# Patient Record
Sex: Male | Born: 1950 | Race: White | Hispanic: No | Marital: Married | Smoking: Former smoker
Health system: Southern US, Community
[De-identification: ages and names within clinical notes are randomized; demographics above are authoritative.]

## PROBLEM LIST (undated history)

## (undated) DIAGNOSIS — E119 Type 2 diabetes mellitus without complications: Secondary | ICD-10-CM

## (undated) DIAGNOSIS — I10 Essential (primary) hypertension: Secondary | ICD-10-CM

## (undated) DIAGNOSIS — I8500 Esophageal varices without bleeding: Secondary | ICD-10-CM

## (undated) HISTORY — PX: CAROTID ENDARTERECTOMY: SUR193

## (undated) HISTORY — PX: TONSILLECTOMY: SUR1361

---

## 2003-12-18 ENCOUNTER — Ambulatory Visit (HOSPITAL_COMMUNITY): Admission: EM | Admit: 2003-12-18 | Discharge: 2003-12-19 | Payer: Self-pay | Admitting: *Deleted

## 2006-02-22 ENCOUNTER — Ambulatory Visit: Payer: Self-pay | Admitting: Gastroenterology

## 2011-11-30 ENCOUNTER — Ambulatory Visit: Payer: Self-pay | Admitting: Oncology

## 2011-12-08 ENCOUNTER — Ambulatory Visit: Payer: Self-pay | Admitting: Oncology

## 2011-12-08 LAB — IRON AND TIBC
Iron Bind.Cap.(Total): 430 ug/dL (ref 250–450)
Iron Saturation: 24 %
Iron: 103 ug/dL (ref 65–175)
Unbound Iron-Bind.Cap.: 327 ug/dL

## 2011-12-08 LAB — CBC CANCER CENTER
Basophil #: 0 x10 3/mm (ref 0.0–0.1)
Eosinophil %: 1.6 %
HGB: 13.9 g/dL (ref 13.0–18.0)
Lymphocyte %: 23 %
Monocyte %: 8.9 %
Neutrophil %: 65.8 %
Platelet: 46 x10 3/mm — ABNORMAL LOW (ref 150–440)
RBC: 3.93 10*6/uL — ABNORMAL LOW (ref 4.40–5.90)
WBC: 2.4 x10 3/mm — ABNORMAL LOW (ref 3.8–10.6)

## 2011-12-08 LAB — LACTATE DEHYDROGENASE: LDH: 158 U/L (ref 87–241)

## 2011-12-08 LAB — FOLATE: Folic Acid: 7.6 ng/mL (ref 3.1–100.0)

## 2011-12-22 LAB — CBC CANCER CENTER
Basophil #: 0 x10 3/mm (ref 0.0–0.1)
Eosinophil #: 0.1 x10 3/mm (ref 0.0–0.7)
Eosinophil %: 3 %
HGB: 13.9 g/dL (ref 13.0–18.0)
Lymphocyte %: 22 %
MCH: 35 pg — ABNORMAL HIGH (ref 26.0–34.0)
MCHC: 32.9 g/dL (ref 32.0–36.0)
MCV: 106.2 fL — ABNORMAL HIGH (ref 80–100)
Neutrophil %: 62.2 %
Platelet: 45 x10 3/mm — ABNORMAL LOW (ref 150–440)
RDW: 13.3 % (ref 11.5–14.5)

## 2011-12-24 ENCOUNTER — Ambulatory Visit: Payer: Self-pay | Admitting: Oncology

## 2012-01-05 LAB — CBC CANCER CENTER
Basophil %: 0.6 %
Eosinophil #: 0.1 x10 3/mm (ref 0.0–0.7)
Eosinophil %: 2.7 %
HCT: 43.6 % (ref 40.0–52.0)
HGB: 14.1 g/dL (ref 13.0–18.0)
Lymphocyte %: 27.2 %
MCHC: 32.4 g/dL (ref 32.0–36.0)
Monocyte #: 0.4 x10 3/mm (ref 0.0–0.7)
Monocyte %: 11.8 %
Neutrophil #: 1.7 x10 3/mm (ref 1.4–6.5)
Platelet: 45 x10 3/mm — ABNORMAL LOW (ref 150–440)
RBC: 4.08 10*6/uL — ABNORMAL LOW (ref 4.40–5.90)

## 2012-01-12 LAB — CBC CANCER CENTER
Basophil #: 0 x10 3/mm (ref 0.0–0.1)
Basophil %: 1.2 %
Eosinophil %: 2.4 %
HGB: 13.7 g/dL (ref 13.0–18.0)
Lymphocyte #: 0.7 x10 3/mm — ABNORMAL LOW (ref 1.0–3.6)
MCH: 35.2 pg — ABNORMAL HIGH (ref 26.0–34.0)
MCHC: 33.3 g/dL (ref 32.0–36.0)
MCV: 105.8 fL — ABNORMAL HIGH (ref 80–100)
Monocyte #: 0.3 x10 3/mm (ref 0.0–0.7)
Neutrophil #: 1.1 x10 3/mm — ABNORMAL LOW (ref 1.4–6.5)
Neutrophil %: 48.1 %
Platelet: 62 x10 3/mm — ABNORMAL LOW (ref 150–440)
RBC: 3.88 10*6/uL — ABNORMAL LOW (ref 4.40–5.90)
RDW: 13.1 % (ref 11.5–14.5)
WBC: 2.2 x10 3/mm — ABNORMAL LOW (ref 3.8–10.6)

## 2012-01-21 ENCOUNTER — Ambulatory Visit: Payer: Self-pay | Admitting: Oncology

## 2012-02-23 ENCOUNTER — Ambulatory Visit: Payer: Self-pay | Admitting: Oncology

## 2012-02-23 LAB — CBC CANCER CENTER
Basophil #: 0 x10 3/mm (ref 0.0–0.1)
Basophil %: 1.1 %
Eosinophil #: 0.1 x10 3/mm (ref 0.0–0.7)
HGB: 15 g/dL (ref 13.0–18.0)
MCH: 35 pg — ABNORMAL HIGH (ref 26.0–34.0)
MCHC: 33.3 g/dL (ref 32.0–36.0)
MCV: 105.2 fL — ABNORMAL HIGH (ref 80–100)
Monocyte #: 0.4 x10 3/mm (ref 0.0–0.7)
Platelet: 49 x10 3/mm — ABNORMAL LOW (ref 150–440)
RBC: 4.3 10*6/uL — ABNORMAL LOW (ref 4.40–5.90)
RDW: 13.4 % (ref 11.5–14.5)

## 2012-03-22 ENCOUNTER — Ambulatory Visit: Payer: Self-pay | Admitting: Oncology

## 2012-04-19 LAB — CBC CANCER CENTER
Basophil #: 0 x10 3/mm (ref 0.0–0.1)
Basophil %: 0.9 %
HGB: 13.8 g/dL (ref 13.0–18.0)
Lymphocyte %: 27.9 %
MCH: 35.8 pg — ABNORMAL HIGH (ref 26.0–34.0)
MCHC: 33.8 g/dL (ref 32.0–36.0)
MCV: 106 fL — ABNORMAL HIGH (ref 80–100)
Monocyte #: 0.3 x10 3/mm (ref 0.2–1.0)
Monocyte %: 11.2 %
Neutrophil #: 1.4 x10 3/mm (ref 1.4–6.5)
Neutrophil %: 57 %
WBC: 2.4 x10 3/mm — ABNORMAL LOW (ref 3.8–10.6)

## 2012-04-22 ENCOUNTER — Ambulatory Visit: Payer: Self-pay

## 2012-04-22 ENCOUNTER — Ambulatory Visit: Payer: Self-pay | Admitting: Oncology

## 2012-07-26 ENCOUNTER — Ambulatory Visit: Payer: Self-pay | Admitting: Oncology

## 2012-07-26 LAB — CBC CANCER CENTER
Basophil %: 0.7 %
Eosinophil #: 0.1 x10 3/mm (ref 0.0–0.7)
Eosinophil %: 2.8 %
HGB: 14.9 g/dL (ref 13.0–18.0)
Lymphocyte %: 22.8 %
MCHC: 33.7 g/dL (ref 32.0–36.0)
Neutrophil %: 59.4 %
Platelet: 64 x10 3/mm — ABNORMAL LOW (ref 150–440)
RBC: 4.11 10*6/uL — ABNORMAL LOW (ref 4.40–5.90)
RDW: 13.3 % (ref 11.5–14.5)
WBC: 3.9 x10 3/mm (ref 3.8–10.6)

## 2012-08-22 ENCOUNTER — Ambulatory Visit: Payer: Self-pay | Admitting: Oncology

## 2012-11-01 ENCOUNTER — Ambulatory Visit: Payer: Self-pay | Admitting: Oncology

## 2012-11-01 LAB — CBC CANCER CENTER
Basophil #: 0 x10 3/mm (ref 0.0–0.1)
Eosinophil #: 0.1 x10 3/mm (ref 0.0–0.7)
HGB: 13.6 g/dL (ref 13.0–18.0)
Lymphocyte %: 24.6 %
MCH: 36 pg — ABNORMAL HIGH (ref 26.0–34.0)
MCHC: 33.9 g/dL (ref 32.0–36.0)
MCV: 106 fL — ABNORMAL HIGH (ref 80–100)
Monocyte #: 0.4 x10 3/mm (ref 0.2–1.0)
Monocyte %: 12.4 %
Neutrophil #: 1.7 x10 3/mm (ref 1.4–6.5)
RBC: 3.78 10*6/uL — ABNORMAL LOW (ref 4.40–5.90)
RDW: 14.1 % (ref 11.5–14.5)

## 2012-11-22 ENCOUNTER — Ambulatory Visit: Payer: Self-pay | Admitting: Oncology

## 2012-12-20 ENCOUNTER — Ambulatory Visit: Payer: Self-pay | Admitting: Vascular Surgery

## 2013-01-09 ENCOUNTER — Ambulatory Visit: Payer: Self-pay | Admitting: Cardiology

## 2013-01-23 ENCOUNTER — Ambulatory Visit: Payer: Self-pay | Admitting: Vascular Surgery

## 2013-01-23 LAB — BASIC METABOLIC PANEL
Anion Gap: 7 (ref 7–16)
BUN: 16 mg/dL (ref 7–18)
Calcium, Total: 9.1 mg/dL (ref 8.5–10.1)
Chloride: 107 mmol/L (ref 98–107)
Co2: 26 mmol/L (ref 21–32)
Creatinine: 0.83 mg/dL (ref 0.60–1.30)
Glucose: 138 mg/dL — ABNORMAL HIGH (ref 65–99)
Osmolality: 283 (ref 275–301)
Sodium: 140 mmol/L (ref 136–145)

## 2013-01-23 LAB — CBC
HCT: 40.3 % (ref 40.0–52.0)
MCV: 104 fL — ABNORMAL HIGH (ref 80–100)
Platelet: 50 10*3/uL — ABNORMAL LOW (ref 150–440)
RDW: 13.6 % (ref 11.5–14.5)

## 2013-01-26 ENCOUNTER — Inpatient Hospital Stay: Payer: Self-pay | Admitting: Vascular Surgery

## 2013-01-27 LAB — CBC WITH DIFFERENTIAL/PLATELET
Basophil %: 0.9 %
Eosinophil %: 2.9 %
HCT: 33.3 % — ABNORMAL LOW (ref 40.0–52.0)
Lymphocyte #: 0.7 10*3/uL — ABNORMAL LOW (ref 1.0–3.6)
Lymphocyte %: 25.1 %
MCH: 35.4 pg — ABNORMAL HIGH (ref 26.0–34.0)
MCV: 105 fL — ABNORMAL HIGH (ref 80–100)
Neutrophil #: 1.6 10*3/uL (ref 1.4–6.5)
Platelet: 57 10*3/uL — ABNORMAL LOW (ref 150–440)
RBC: 3.19 10*6/uL — ABNORMAL LOW (ref 4.40–5.90)

## 2013-01-27 LAB — BASIC METABOLIC PANEL
Anion Gap: 8 (ref 7–16)
Calcium, Total: 7.8 mg/dL — ABNORMAL LOW (ref 8.5–10.1)
Co2: 26 mmol/L (ref 21–32)
EGFR (African American): >60
Glucose: 113 mg/dL — ABNORMAL HIGH (ref 65–99)
Osmolality: 286 (ref 275–301)

## 2013-01-27 LAB — APTT: Activated PTT: 38.1 secs — ABNORMAL HIGH (ref 23.6–35.9)

## 2013-01-27 LAB — PROTIME-INR: Prothrombin Time: 15.1 secs — ABNORMAL HIGH (ref 11.5–14.7)

## 2013-02-28 ENCOUNTER — Ambulatory Visit: Payer: Self-pay | Admitting: Oncology

## 2013-02-28 LAB — CBC CANCER CENTER
Basophil #: 0 x10 3/mm (ref 0.0–0.1)
HCT: 37.6 % — ABNORMAL LOW (ref 40.0–52.0)
HGB: 12.8 g/dL — ABNORMAL LOW (ref 13.0–18.0)
Lymphocyte %: 29.3 %
MCHC: 34 g/dL (ref 32.0–36.0)
Monocyte #: 0.3 x10 3/mm (ref 0.2–1.0)
Neutrophil #: 1.1 x10 3/mm — ABNORMAL LOW (ref 1.4–6.5)
Neutrophil %: 53.1 %
RBC: 3.69 10*6/uL — ABNORMAL LOW (ref 4.40–5.90)
RDW: 14.7 % — ABNORMAL HIGH (ref 11.5–14.5)
WBC: 2 x10 3/mm — CL (ref 3.8–10.6)

## 2013-03-22 ENCOUNTER — Ambulatory Visit: Payer: Self-pay | Admitting: Oncology

## 2013-04-11 LAB — CBC CANCER CENTER
Basophil #: 0 x10 3/mm (ref 0.0–0.1)
Basophil %: 0.8 %
HCT: 38.8 % — ABNORMAL LOW (ref 40.0–52.0)
Lymphocyte #: 0.9 x10 3/mm — ABNORMAL LOW (ref 1.0–3.6)
Lymphocyte %: 39.3 %
MCH: 34.9 pg — ABNORMAL HIGH (ref 26.0–34.0)
MCHC: 33.3 g/dL (ref 32.0–36.0)
MCV: 105 fL — ABNORMAL HIGH (ref 80–100)
Monocyte #: 0.3 x10 3/mm (ref 0.2–1.0)
Monocyte %: 14.4 %
Neutrophil #: 0.9 x10 3/mm — ABNORMAL LOW (ref 1.4–6.5)
Neutrophil %: 41.5 %
Platelet: 51 x10 3/mm — ABNORMAL LOW (ref 150–440)
RBC: 3.71 10*6/uL — ABNORMAL LOW (ref 4.40–5.90)
RDW: 15.7 % — ABNORMAL HIGH (ref 11.5–14.5)

## 2013-04-22 ENCOUNTER — Ambulatory Visit: Payer: Self-pay | Admitting: Oncology

## 2013-05-23 ENCOUNTER — Ambulatory Visit: Payer: Self-pay | Admitting: Oncology

## 2013-05-23 LAB — CBC CANCER CENTER
Basophil %: 1.3 %
Eosinophil #: 0.1 x10 3/mm (ref 0.0–0.7)
Eosinophil %: 3.8 %
HCT: 38.6 % — ABNORMAL LOW (ref 40.0–52.0)
MCV: 103 fL — ABNORMAL HIGH (ref 80–100)
Neutrophil #: 1.3 x10 3/mm — ABNORMAL LOW (ref 1.4–6.5)
Neutrophil %: 44.8 %
Platelet: 67 x10 3/mm — ABNORMAL LOW (ref 150–440)
RDW: 14 % (ref 11.5–14.5)

## 2013-06-22 ENCOUNTER — Ambulatory Visit: Payer: Self-pay | Admitting: Oncology

## 2013-08-29 ENCOUNTER — Ambulatory Visit: Payer: Self-pay | Admitting: Oncology

## 2013-08-29 LAB — CBC CANCER CENTER
Basophil %: 0.8 %
Eosinophil #: 0.1 x10 3/mm (ref 0.0–0.7)
Eosinophil %: 2.9 %
HGB: 12.4 g/dL — ABNORMAL LOW (ref 13.0–18.0)
Lymphocyte #: 0.7 x10 3/mm — ABNORMAL LOW (ref 1.0–3.6)
Lymphocyte %: 26.8 %
MCH: 32.6 pg (ref 26.0–34.0)
MCHC: 32.9 g/dL (ref 32.0–36.0)
MCV: 99 fL (ref 80–100)
Neutrophil %: 55.7 %
RBC: 3.81 10*6/uL — ABNORMAL LOW (ref 4.40–5.90)
WBC: 2.8 x10 3/mm — ABNORMAL LOW (ref 3.8–10.6)

## 2013-09-22 ENCOUNTER — Ambulatory Visit: Payer: Self-pay | Admitting: Oncology

## 2013-12-12 ENCOUNTER — Ambulatory Visit: Payer: Self-pay | Admitting: Oncology

## 2013-12-12 LAB — CBC CANCER CENTER
BASOS ABS: 0 x10 3/mm (ref 0.0–0.1)
Basophil %: 0.9 %
EOS ABS: 0.1 x10 3/mm (ref 0.0–0.7)
EOS PCT: 2.5 %
HCT: 37.5 % — AB (ref 40.0–52.0)
HGB: 12.4 g/dL — AB (ref 13.0–18.0)
LYMPHS ABS: 0.8 x10 3/mm — AB (ref 1.0–3.6)
LYMPHS PCT: 28.1 %
MCH: 33.5 pg (ref 26.0–34.0)
MCHC: 33.1 g/dL (ref 32.0–36.0)
MCV: 101 fL — AB (ref 80–100)
MONO ABS: 0.3 x10 3/mm (ref 0.2–1.0)
Monocyte %: 12.2 %
NEUTROS ABS: 1.5 x10 3/mm (ref 1.4–6.5)
Neutrophil %: 56.3 %
Platelet: 61 x10 3/mm — ABNORMAL LOW (ref 150–440)
RBC: 3.69 10*6/uL — ABNORMAL LOW (ref 4.40–5.90)
RDW: 15 % — ABNORMAL HIGH (ref 11.5–14.5)
WBC: 2.7 x10 3/mm — ABNORMAL LOW (ref 3.8–10.6)

## 2013-12-14 ENCOUNTER — Ambulatory Visit: Payer: Self-pay

## 2013-12-23 ENCOUNTER — Ambulatory Visit: Payer: Self-pay | Admitting: Oncology

## 2014-02-01 ENCOUNTER — Ambulatory Visit: Payer: Self-pay | Admitting: Gastroenterology

## 2014-03-01 ENCOUNTER — Ambulatory Visit: Payer: Self-pay | Admitting: Gastroenterology

## 2014-03-19 ENCOUNTER — Ambulatory Visit: Payer: Self-pay | Admitting: Gastroenterology

## 2014-08-16 ENCOUNTER — Ambulatory Visit: Payer: Self-pay | Admitting: Oncology

## 2014-08-16 LAB — CBC CANCER CENTER
BASOS ABS: 0 x10 3/mm (ref 0.0–0.1)
Basophil %: 0.8 %
EOS PCT: 3.2 %
Eosinophil #: 0.1 x10 3/mm (ref 0.0–0.7)
HCT: 36.4 % — AB (ref 40.0–52.0)
HGB: 11.9 g/dL — ABNORMAL LOW (ref 13.0–18.0)
LYMPHS PCT: 26.5 %
Lymphocyte #: 1 x10 3/mm (ref 1.0–3.6)
MCH: 33.5 pg (ref 26.0–34.0)
MCHC: 32.8 g/dL (ref 32.0–36.0)
MCV: 102 fL — AB (ref 80–100)
MONO ABS: 0.5 x10 3/mm (ref 0.2–1.0)
MONOS PCT: 12.9 %
Neutrophil #: 2.1 x10 3/mm (ref 1.4–6.5)
Neutrophil %: 56.6 %
PLATELETS: 56 x10 3/mm — AB (ref 150–440)
RBC: 3.56 10*6/uL — AB (ref 4.40–5.90)
RDW: 15.5 % — ABNORMAL HIGH (ref 11.5–14.5)
WBC: 3.7 x10 3/mm — ABNORMAL LOW (ref 3.8–10.6)

## 2014-08-22 ENCOUNTER — Ambulatory Visit: Payer: Self-pay | Admitting: Oncology

## 2014-12-09 IMAGING — CT CT ANGIOGRAPHY NECK
1 of 4 series · 12 of 33 positions shown · IV contrast (isovue)
Comparison: none

REASON FOR EXAM: labs 1st  carotid stenosis Metformin
COMMENTS:

PROCEDURE:     CT  - CT ANGIOGRAPHY NECK W/CONTRAST  - December 20, 2012  [DATE]
RESULT:
TECHNIQUE: Axial source 3 mm sections were obtained status post intravenous
administration of 100 mL of Isovue 370. Included are coronal and sagittal
maximum intensity reconstructions as well as VRT 3-D reconstructions.

[Series 5: soft tissue · axial · 0.45mm/px · z∈[-266,-6]mm · 12 of 103 slices shown]
[im 8/103  soft-tissue]
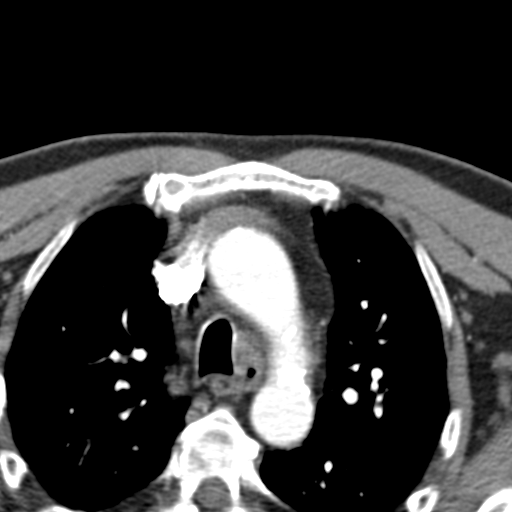
[im 16/103  bone]
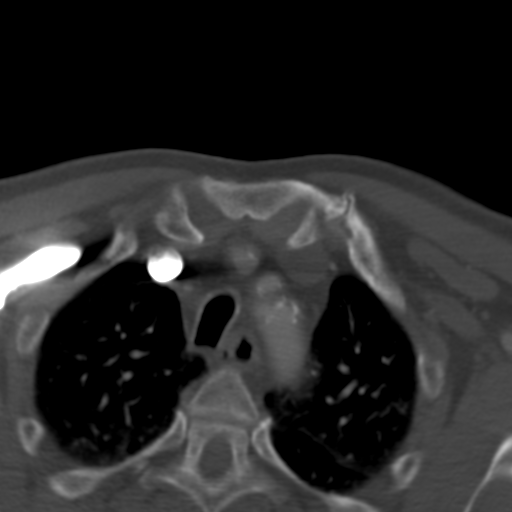
[im 24/103  soft-tissue]
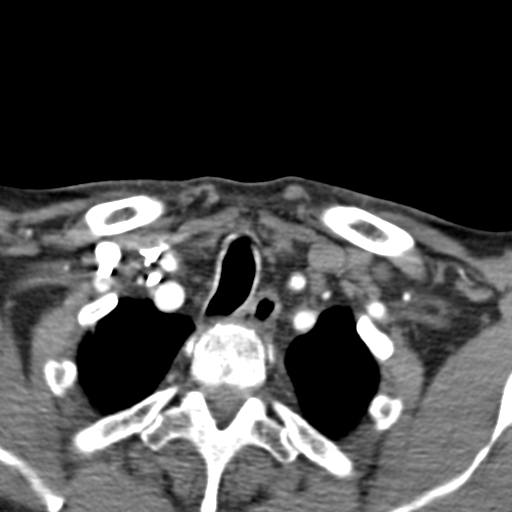
[im 32/103  bone]
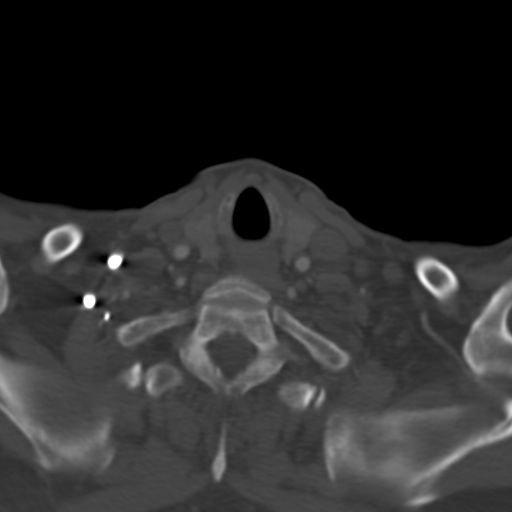
[im 40/103  soft-tissue]
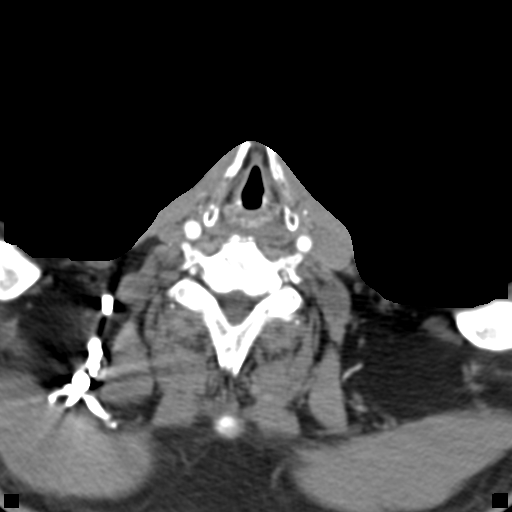
[im 48/103  bone]
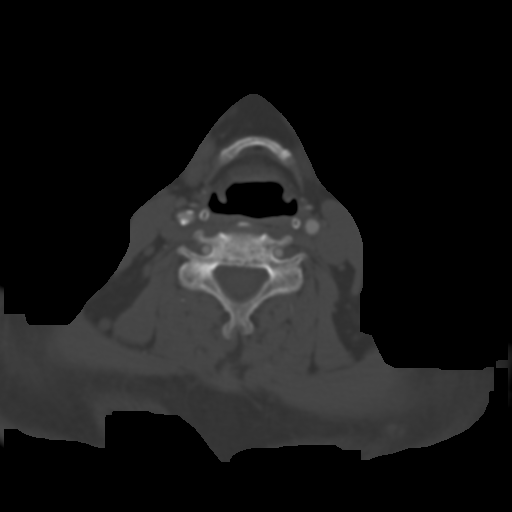
[im 55/103  soft-tissue]
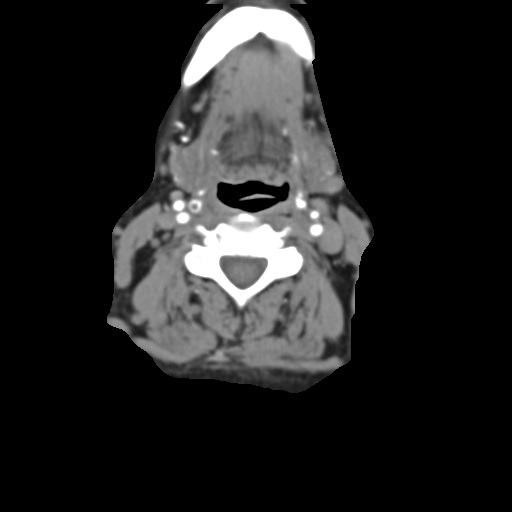
[im 63/103  bone]
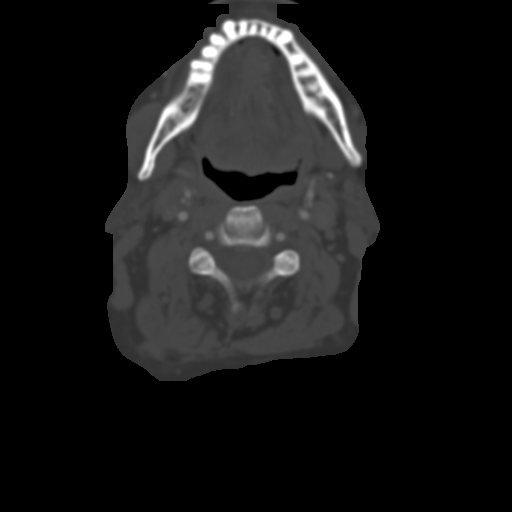
[im 71/103  soft-tissue]
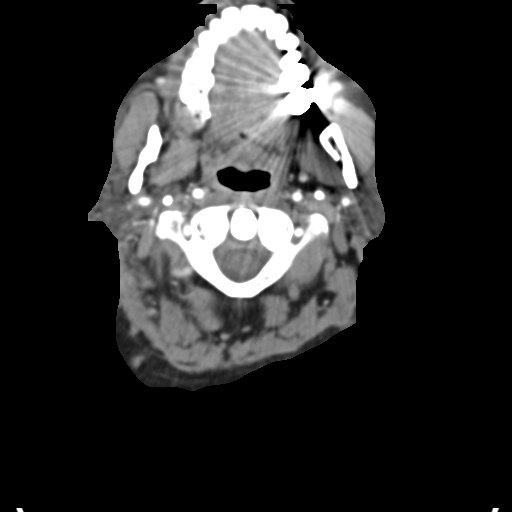
[im 79/103  bone]
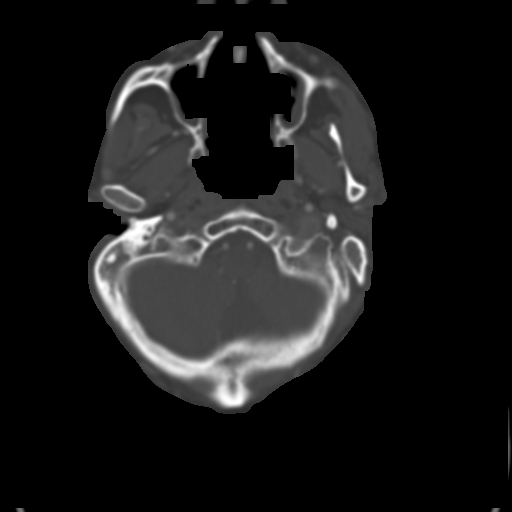
[im 87/103  soft-tissue]
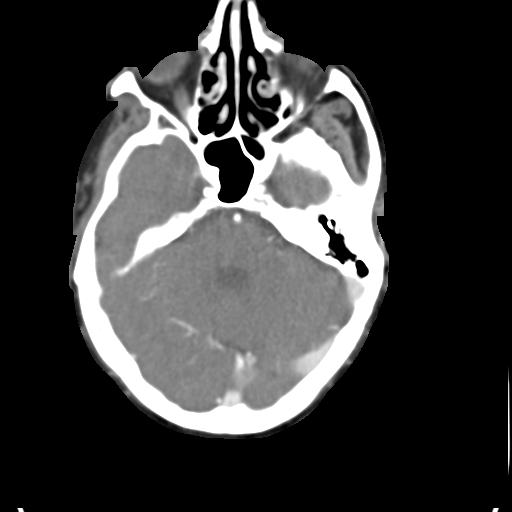
[im 95/103  bone]
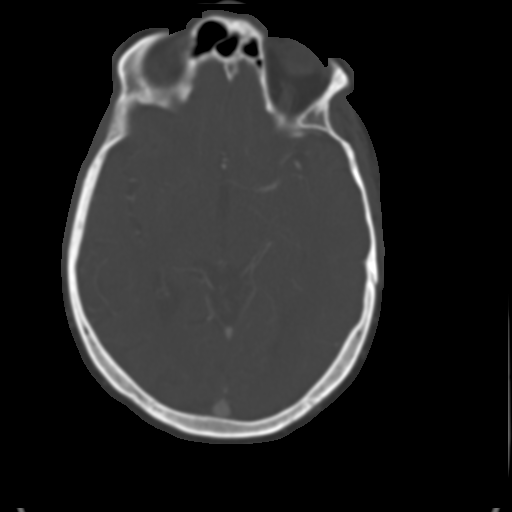

[12 of 33 positions shown; findings below may reference images not displayed]

FINDINGS: Evaluation of the right carotid system demonstrates coarse
calcifications within the carotid bulb extending into the proximal aspect of
the internal carotid artery. Within the level of the mid carotid bulb there
are findings concerning for an area of moderate to high stenosis. The walls
of this region demonstrate coarse mural calcification and this area
demonstrates a region of stenosis of approximately 62%. There is no further
evidence of mural calcifications, focal or segmental stenosis, fusiform
dilatation nor focal outpouchings.

Evaluation of the left carotid system demonstrates coarse mural
calcifications within the carotid bulb extending into the internal carotid
artery. There is an area of moderate stenosis within the distal carotid bulb
of the magnitude of 45%. Within the origin of the internal carotid artery an
area of high-grade stenosis is appreciated demonstrating a magnitude of 79%.
Within the proximal aspect of the common carotid artery approximately 4 to 5
cm from the origin an area of moderate stenosis of 55% is identified.
IMPRESSION: 1. Region of hemodynamically significant stenosis within the origin of the
left internal carotid artery as described above.
2. A region of moderate to possibly high-grade stenosis within the mid
carotid bulb on the right.
3. An area of moderate stenosis within the proximal common carotid artery on
the left.

## 2014-12-23 ENCOUNTER — Ambulatory Visit: Payer: Self-pay | Admitting: Gastroenterology

## 2015-03-12 ENCOUNTER — Ambulatory Visit
Admit: 2015-03-12 | Disposition: A | Discharge status: Home / Self Care | Payer: Self-pay | Attending: Oncology | Admitting: Oncology

## 2015-03-12 LAB — CBC CANCER CENTER
Basophil #: 0 x10 3/mm (ref 0.0–0.1)
Basophil %: 1.6 %
Eosinophil #: 0.1 x10 3/mm (ref 0.0–0.7)
Eosinophil %: 3.9 %
HCT: 33.4 % — AB (ref 40.0–52.0)
HGB: 11.2 g/dL — ABNORMAL LOW (ref 13.0–18.0)
Lymphocyte #: 0.7 x10 3/mm — ABNORMAL LOW (ref 1.0–3.6)
Lymphocyte %: 34.2 %
MCH: 35.6 pg — ABNORMAL HIGH (ref 26.0–34.0)
MCHC: 33.5 g/dL (ref 32.0–36.0)
MCV: 106 fL — ABNORMAL HIGH (ref 80–100)
MONOS PCT: 15 %
Monocyte #: 0.3 x10 3/mm (ref 0.2–1.0)
NEUTROS ABS: 0.9 x10 3/mm — AB (ref 1.4–6.5)
NEUTROS PCT: 45.3 %
Platelet: 55 x10 3/mm — ABNORMAL LOW (ref 150–440)
RBC: 3.14 10*6/uL — ABNORMAL LOW (ref 4.40–5.90)
RDW: 14.3 % (ref 11.5–14.5)
WBC: 2.1 x10 3/mm — AB (ref 3.8–10.6)

## 2015-03-14 NOTE — Op Note (Signed)
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  PROCEDURES PERFORMED: 1.  Left carotid endarterectomy with CorMatrix patch angioplasty.  2.  Repair of arterial defect with CorMatrix patch, xenograft type.   SURGEON:  Levora DredgeGregory Schnier, MD   FIRST ASSISTANT:  Ms. Lamarr LulasChelsea Haney  ANESTHESIA:  General by endotracheal intubation.   FLUIDS:  Per anesthesia record.   ESTIMATED BLOOD LOSS:  200 mL.   SPECIMEN:  Carotid plaque to pathology for permanent section.   INDICATIONS:  The patient is a 64 year old gentleman who experienced neurologic symptoms and subsequently was found to have critical stenosis of the left internal carotid artery. After review of the CT angiogram as well as considerations for his overall medical conditions, surgical endarterectomy was elected. Risks and benefits as well as alternative therapies were all reviewed. All questions were answered. The patient has agreed to proceed.   DESCRIPTION OF PROCEDURE:  The patient is taken to the Operating Room and placed in the supine position. After adequate general anesthesia is induced and appropriate invasive monitors are placed, he is positioned supine with his neck extended slightly and rotated to the right. The left neck and chest wall are then prepped and draped in a sterile fashion.    Appropriate timeout is called.   A curvilinear incision is then created along the anterior margin of sternocleidomastoid muscle and carried down through the soft tissues. External jugular vein is ligated with 2-0 silk ties. Platysma is transected with Bovie cautery. Sternocleidomastoid muscle is then reflected posterolaterally, and the omohyoid muscle is identified. Common carotid artery is then identified at the level of the omohyoid, and the  dissection is carried in a craniad direction. External carotid artery and superior thyroidal artery are looped with a silastic vessel loop. Internal carotid artery is dissected to a level above the point of visible plaque formation, and 7000 units of heparin is given and allowed to circulate for 4 minutes.   The common carotid followed by the external followed by the internal carotid artery is then clamped. Arteriotomy is made, extended with Potts scissors, and an indwelling shunt is placed without difficulty. Flow is reestablished to the brain.   The endarterectomy is then performed under direct visualization for the distal common bulb and internal carotid artery. External carotid artery is treated with eversion technique. Interrupted 7-0 Prolene tacking sutures are then utilized to secure the distal intimal edge within the internal carotid artery. A CorMatrix xenograft patch is then rehydrated on the back table. It is delivered onto the surgical field, trimmed to an appropriate bevel, and applied to repair the arterial defect using running 6-0 Prolene in a 4-quadrant technique. The shunt is removed, copious flushing maneuvers are performed, the suture line is completed. Flow is then reestablished first to the external carotid artery and then the internal carotid artery to prevent distal embolization.   Suture line is then inspected for hemostasis. The pressure is allowed to arise to approximately 130. Two small areas of bleeding along the suture line are easily controlled with an interrupted 6-0 Prolene suture. Evicel and fibrillar are then placed along the arterial bed. Pressure is held for approximately 5 minutes. It should be noted that the patient has chronic thrombocytopenia, and received a unit of platelets at the beginning of the surgery, and now, once the shunt has been removed, a  second unit of platelets is transfused. After hemostasis had been secured, the wound was then closed with running 3-0  Vicryl for the platysma, followed by 4-0 Monocryl subcuticular for skin, and then Dermabond. The patient tolerated the procedure well. He was awakened in the Operating Room, moving all extremities and obeying simple commands. He was taken to the recovery area in stable condition.   ____________________________ Renford Dills, MD ggs:mr D: 01/27/2013 13:52:00 ET T: 01/27/2013 18:47:07 ET JOB#: [id number]  cc: Stann Mainland. Sampson Goon, MD Renford Dills, MD, <Dictator>   Renford Dills MD ELECTRONICALLY SIGNED 02/20/2013 17:19

## 2015-03-14 NOTE — Discharge Summary (Signed)
  DATE OF BIRTH:  [birth year]  DATE OF ADMISSION:  01/26/2013  DATE OF DISCHARGE:  01/27/2013  DIAGNOSES:  1.  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  2.  Hypertension.  3.  Diabetes.  4.  Hypercholesterolemia.   PROCEDURE PERFORMED:  Left carotid endarterectomy. Date:  01/26/2013.   HISTORY: The patient is a 64 year old gentleman who has been worked up for symptomatic critical stenosis of the left internal carotid artery, now presents for surgical repair. The risks and benefits were reviewed. All questions answered. The patient agreed to proceed.   HOSPITAL COURSE:  On the day of admission, the patient underwent successful left carotid endarterectomy. Postoperative course was uneventful. Blood pressure was well-controlled. He was continued on his home medications. On postoperative day #1, he was tolerating a diabetic diet, and is felt fit for discharge. He is discharged to home. He will continue his home medications as ordered. He is given a prescription for Vicodin. He will follow up in one week. Full activities will be resumed after reevaluation in the office.     ____________________________ Renford DillsGregory G. Schnier, MD ggs:mr D: 01/27/2013 13:54:27 ET T: 01/27/2013 18:43:08 ET JOB#: [id number]  cc: Renford DillsGregory G. Schnier, MD, <Dictator> Stann Mainlandavid P. Sampson GoonFitzgerald, MD  Renford DillsGREGORY G SCHNIER MD ELECTRONICALLY SIGNED 02/20/2013 17:19

## 2015-03-24 ENCOUNTER — Ambulatory Visit: Payer: Medicare Other | Admitting: Anesthesiology

## 2015-03-24 ENCOUNTER — Ambulatory Visit
Admission: RE | Admit: 2015-03-24 | Discharge: 2015-03-24 | Disposition: A | Discharge status: Home / Self Care | Payer: Medicare Other | Source: Ambulatory Visit | Attending: Gastroenterology | Admitting: Gastroenterology

## 2015-03-24 ENCOUNTER — Encounter: Payer: Self-pay | Admitting: *Deleted

## 2015-03-24 ENCOUNTER — Encounter
Admission: RE | Disposition: A | Discharge status: Home / Self Care | Payer: Self-pay | Source: Ambulatory Visit | Attending: Gastroenterology

## 2015-03-24 DIAGNOSIS — K3189 Other diseases of stomach and duodenum: Secondary | ICD-10-CM | POA: Diagnosis not present

## 2015-03-24 DIAGNOSIS — I8500 Esophageal varices without bleeding: Secondary | ICD-10-CM | POA: Diagnosis present

## 2015-03-24 DIAGNOSIS — K766 Portal hypertension: Secondary | ICD-10-CM | POA: Diagnosis not present

## 2015-03-24 HISTORY — DX: Esophageal varices without bleeding: I85.00

## 2015-03-24 HISTORY — PX: ESOPHAGOGASTRODUODENOSCOPY: SHX5428

## 2015-03-24 HISTORY — DX: Type 2 diabetes mellitus without complications: E11.9

## 2015-03-24 HISTORY — DX: Essential (primary) hypertension: I10

## 2015-03-24 LAB — GLUCOSE, CAPILLARY: Glucose-Capillary: 115 mg/dL — ABNORMAL HIGH (ref 70–99)

## 2015-03-24 SURGERY — EGD (ESOPHAGOGASTRODUODENOSCOPY)
Anesthesia: General

## 2015-03-24 MED ORDER — ONDANSETRON HCL 4 MG/2ML IJ SOLN
INTRAMUSCULAR | Status: DC | PRN
  Administered 2015-03-24: 4 mg via INTRAVENOUS

## 2015-03-24 MED ORDER — SODIUM CHLORIDE 0.9 % IV SOLN
INTRAVENOUS | Status: DC
  Administered 2015-03-24: 1000 mL via INTRAVENOUS

## 2015-03-24 MED ORDER — SODIUM CHLORIDE 0.9 % IV SOLN: INTRAVENOUS | Status: DC

## 2015-03-24 MED ORDER — MIDAZOLAM HCL 2 MG/2ML IJ SOLN
INTRAMUSCULAR | Status: DC | PRN
  Administered 2015-03-24: 1 mg via INTRAVENOUS

## 2015-03-24 MED ORDER — LACTATED RINGERS IV SOLN
INTRAVENOUS | Status: DC | PRN
  Administered 2015-03-24: 08:00:00 via INTRAVENOUS

## 2015-03-24 MED ORDER — FENTANYL CITRATE (PF) 100 MCG/2ML IJ SOLN
INTRAMUSCULAR | Status: DC | PRN
Start: 2015-03-24 — End: 2015-03-24
  Administered 2015-03-24: 50 ug via INTRAVENOUS

## 2015-03-24 MED ORDER — PROPOFOL INFUSION 10 MG/ML OPTIME
INTRAVENOUS | Status: DC | PRN
  Administered 2015-03-24: 200 ug/kg/min via INTRAVENOUS

## 2015-03-24 MED ORDER — LIDOCAINE HCL (CARDIAC) 20 MG/ML IV SOLN
INTRAVENOUS | Status: DC | PRN
  Administered 2015-03-24: 60 mg via INTRAVENOUS

## 2015-03-24 MED ORDER — GLYCOPYRROLATE 0.2 MG/ML IJ SOLN
INTRAMUSCULAR | Status: DC | PRN
  Administered 2015-03-24: 0.2 mg via INTRAVENOUS

## 2015-03-24 MED ORDER — PROPOFOL 10 MG/ML IV BOLUS
INTRAVENOUS | Status: DC | PRN
Start: 2015-03-24 — End: 2015-03-24
  Administered 2015-03-24: 20 mg via INTRAVENOUS
  Administered 2015-03-24: 30 mg via INTRAVENOUS

## 2015-03-24 NOTE — H&P (Signed)
  F/U esophageal varices  Meds: see above NKDA  PE. NAD,VSS CV:RRR Lungs:CTA Abd: Ventral hernia, NABS, soft, nontender Ext:-C/C/E  Imp: Hx of esophageal varices  Plan: EGD with possible banding.

## 2015-03-24 NOTE — Op Note (Addendum)
[facility] Gastroenterology Patient Name: Jay Mejia Procedure Date: 03/24/2015 8:02 AM MRN: [id number] Account #: [id number] Date of Birth: 1950/12/21 Admit Type: Outpatient Age: 64 Room: [facility] ENDO ROOM 4 Gender: Male Note Status: Finalized Procedure:         Upper GI endoscopy Indications:       Follow-up of esophageal varices Providers:         Ezzard Standing. Bluford Kaufmann, MD Referring MD:      Teena Irani. Sampson Goon, MD (Referring MD) Medicines:         Monitored Anesthesia Care Complications:     No immediate complications. Procedure:         Pre-Anesthesia Assessment:                    - Prior to the procedure, a History and Physical was                     performed, and patient medications, allergies and                     sensitivities were reviewed. The patient's tolerance of                     previous anesthesia was reviewed.                    - The risks and benefits of the procedure and the sedation                     options and risks were discussed with the patient. All                     questions were answered and informed consent was obtained.                    - After reviewing the risks and benefits, the patient was                     deemed in satisfactory condition to undergo the procedure.                    After obtaining informed consent, the endoscope was passed                     under direct vision. Throughout the procedure, the                     patient's blood pressure, pulse, and oxygen saturations                     were monitored continuously. The Endoscope was introduced                     through the mouth, and advanced to the second part of                     duodenum. The upper GI endoscopy was accomplished without                     difficulty. The patient tolerated the procedure well. Findings:      Grade I varices were found in the lower third of the esophagus. They       were small in largest diameter. No more banding  needed  The exam was otherwise without abnormality.      Mild portal hypertensive gastropathy was found in the gastric fundus.      The exam was otherwise without abnormality.      The examined duodenum was normal. Impression:        - Grade I esophageal varices.                    - The examination was otherwise normal.                    - Portal hypertensive gastropathy.                    - The examination was otherwise normal.                    - Normal examined duodenum.                    - No specimens collected. Recommendation:    - Discharge patient to home.                    - Continue present medications.                    - Observe patient's clinical course.                    - The findings and recommendations were discussed with the                     patient. Procedure Code(s): --- Professional ---                    [phone], Esophagogastroduodenoscopy, flexible, transoral;                     diagnostic, including collection of specimen(s) by                     brushing or washing, when performed (separate procedure) Diagnosis Code(s): --- Professional ---                    I85.00, Esophageal varices without bleeding                    K76.6, Portal hypertension                    K31.89, Other diseases of stomach and duodenum CPT copyright 2014 American Medical Association. All rights reserved. The codes documented in this report are preliminary and upon coder review may  be revised to meet current compliance requirements. Wallace CullensPaul Y Jannetta Massey, MD 03/24/2015 8:33:44 AM This report has been signed electronically. Number of Addenda: 0 Note Initiated On: 03/24/2015 8:02 AM      [facility]

## 2015-03-24 NOTE — Transfer of Care (Signed)
Immediate Anesthesia Transfer of Care Note  Patient: Jay Mejia  Procedure(s) Performed: Procedure(s): ESOPHAGOGASTRODUODENOSCOPY (EGD) (N/A)  Patient Location: PACU  Anesthesia Type:MAC  Level of Consciousness: sedated  Airway & Oxygen Therapy: Patient Spontanous Breathing  Post-op Assessment: Report given to RN  Post vital signs: stable  Last Vitals:  Filed Vitals:   03/24/15 0845  BP:   Temp: 36.8 C  Resp:     Complications: No apparent anesthesia complications

## 2015-03-24 NOTE — Anesthesia Postprocedure Evaluation (Signed)
  Anesthesia Post-op Note  Patient: Jay Mejia  Procedure(s) Performed: Procedure(s): ESOPHAGOGASTRODUODENOSCOPY (EGD) (N/A)  Anesthesia type:General  Patient location: PACU  Post pain: Pain level controlled  Post assessment: Post-op Vital signs reviewed, Patient's Cardiovascular Status Stable, Respiratory Function Stable, Patent Airway and No signs of Nausea or vomiting  Post vital signs: Reviewed and stable  Last Vitals:  Filed Vitals:   03/24/15 0900  BP: 117/68  Pulse:   Temp:   Resp:     Level of consciousness: awake, alert  and patient cooperative  Complications: No apparent anesthesia complications

## 2015-03-24 NOTE — Anesthesia Preprocedure Evaluation (Signed)
Anesthesia Evaluation  Patient identified by MRN, date of birth, ID band Patient awake    Reviewed: Allergy & Precautions, H&P , NPO status , Patient's Chart, lab work & pertinent test results, reviewed documented beta blocker date and time   Airway Mallampati: II  TM Distance: >3 FB Neck ROM: full    Dental   Pulmonary          Cardiovascular hypertension, Pt. on medications     Neuro/Psych    GI/Hepatic GERD-  Medicated and Controlled,  Endo/Other  diabetes, Well Controlled, Type 2  Renal/GU      Musculoskeletal   Abdominal   Peds  Hematology   Anesthesia Other Findings   Reproductive/Obstetrics                             Anesthesia Physical Anesthesia Plan  ASA: II  Anesthesia Plan: General   Post-op Pain Management:    Induction:   Airway Management Planned:   Additional Equipment:   Intra-op Plan:   Post-operative Plan:   Informed Consent: I have reviewed the patients History and Physical, chart, labs and discussed the procedure including the risks, benefits and alternatives for the proposed anesthesia with the patient or authorized representative who has indicated his/her understanding and acceptance.     Plan Discussed with: CRNA  Anesthesia Plan Comments:         Anesthesia Quick Evaluation

## 2015-03-27 ENCOUNTER — Encounter: Payer: Self-pay | Admitting: Gastroenterology

## 2015-09-10 ENCOUNTER — Ambulatory Visit: Payer: Self-pay | Admitting: Oncology

## 2015-09-17 ENCOUNTER — Inpatient Hospital Stay: Payer: Medicare Other

## 2015-09-17 ENCOUNTER — Inpatient Hospital Stay: Payer: Medicare Other | Attending: Oncology | Admitting: Oncology

## 2015-09-17 VITALS — BP 180/91 | HR 82 | Temp 96.4°F | Resp 16 | Wt 168.7 lb

## 2015-09-17 DIAGNOSIS — Z87891 Personal history of nicotine dependence: Secondary | ICD-10-CM | POA: Insufficient documentation

## 2015-09-17 DIAGNOSIS — D61818 Other pancytopenia: Secondary | ICD-10-CM | POA: Insufficient documentation

## 2015-09-17 DIAGNOSIS — R161 Splenomegaly, not elsewhere classified: Secondary | ICD-10-CM | POA: Diagnosis not present

## 2015-09-17 DIAGNOSIS — I8500 Esophageal varices without bleeding: Secondary | ICD-10-CM | POA: Insufficient documentation

## 2015-09-17 DIAGNOSIS — D696 Thrombocytopenia, unspecified: Secondary | ICD-10-CM | POA: Diagnosis not present

## 2015-09-17 DIAGNOSIS — I10 Essential (primary) hypertension: Secondary | ICD-10-CM

## 2015-09-17 DIAGNOSIS — Z79899 Other long term (current) drug therapy: Secondary | ICD-10-CM | POA: Diagnosis not present

## 2015-09-17 DIAGNOSIS — Z7982 Long term (current) use of aspirin: Secondary | ICD-10-CM | POA: Diagnosis not present

## 2015-09-17 DIAGNOSIS — E119 Type 2 diabetes mellitus without complications: Secondary | ICD-10-CM | POA: Diagnosis not present

## 2015-09-17 LAB — CBC WITH DIFFERENTIAL/PLATELET
BASOS PCT: 1 %
Basophils Absolute: 0 10*3/uL (ref 0–0.1)
EOS PCT: 4 %
Eosinophils Absolute: 0.1 10*3/uL (ref 0–0.7)
HCT: 33.6 % — ABNORMAL LOW (ref 40.0–52.0)
Hemoglobin: 11.2 g/dL — ABNORMAL LOW (ref 13.0–18.0)
Lymphocytes Relative: 19 %
Lymphs Abs: 0.6 10*3/uL — ABNORMAL LOW (ref 1.0–3.6)
MCH: 34.8 pg — ABNORMAL HIGH (ref 26.0–34.0)
MCHC: 33.5 g/dL (ref 32.0–36.0)
MCV: 104 fL — ABNORMAL HIGH (ref 80.0–100.0)
MONO ABS: 0.4 10*3/uL (ref 0.2–1.0)
MONOS PCT: 12 %
NEUTROS PCT: 64 %
Neutro Abs: 2 10*3/uL (ref 1.4–6.5)
PLATELETS: 48 10*3/uL — AB (ref 150–440)
RBC: 3.23 MIL/uL — ABNORMAL LOW (ref 4.40–5.90)
RDW: 14.3 % (ref 11.5–14.5)
WBC: 3.1 10*3/uL — ABNORMAL LOW (ref 3.8–10.6)

## 2015-09-26 NOTE — Progress Notes (Signed)
[place]  [phone] [phone]  ID: Jay Mejia OB: 09-14-1951  MR#: [id number]  IHK#:[id number]  Patient Care Team: Adrian Prows, MD as PCP - General (Infectious Diseases)  CHIEF COMPLAINT:  Chief Complaint  Patient presents with  . thrombocytopenia    INTERVAL HISTORY: Patient return to clinic today for further evaluation and laboratory work.  He continues to feel well and is asymptomatic. He continues to remain active and travel extensively.  He denies any easy bleeding or bruising. He denies any recent fevers or illnesses.  He has a good appetite and denies weight loss.  He has no neurologic complaints.  He denies any chest pain or shortness of breath.  He denies any nausea, vomiting, constipation, or diarrhea.  He has no melena or hematochezia.  He has no urinary complaints.  Patient offers no specific complaints today.   REVIEW OF SYSTEMS:   Review of Systems  Constitutional: Negative.   Respiratory: Negative.   Cardiovascular: Negative.   Musculoskeletal: Negative.   Neurological: Negative.   Endo/Heme/Allergies: Does not bruise/bleed easily.    As per HPI. Otherwise, a complete review of systems is negatve.  PAST MEDICAL HISTORY: Past Medical History  Diagnosis Date  . Hypertension   . Diabetes mellitus without complication   . 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     PAST SURGICAL HISTORY: Past Surgical History  Procedure Laterality Date  . Carotid endarterectomy    . Tonsillectomy    . Esophagogastroduodenoscopy N/A 03/24/2015    Procedure: ESOPHAGOGASTRODUODENOSCOPY (EGD);  Surgeon: Hulen Luster, MD;  Location: [facility] ENDOSCOPY;  Service: Gastroenterology;  Laterality: N/A;    FAMILY HISTORY No family history on file.     ADVANCED DIRECTIVES:    HEALTH MAINTENANCE: Social History  Substance Use Topics  . Smoking status: Former Research scientist (life sciences)  . Smokeless tobacco: Not on file  . Alcohol Use: Not on file       Colonoscopy:  PAP:  Bone density:  Lipid panel:  No Known Allergies  Current Outpatient Prescriptions  Medication Sig Dispense Refill  . ALPRAZolam (XANAX) 0.25 MG tablet Take 0.25 mg by mouth at bedtime as needed for sleep.    Marland Kitchen aspirin EC 81 MG tablet Take 81 mg by mouth daily.    . enalapril (VASOTEC) 5 MG tablet Take 5 mg by mouth daily.    . metFORMIN (GLUCOPHAGE) 1000 MG tablet Take 1,000 mg by mouth 2 (two) times daily with a meal.    . nadolol (CORGARD) 40 MG tablet Take 40 mg by mouth daily.    Marland Kitchen omeprazole (PRILOSEC) 20 MG capsule Take 20 mg by mouth daily.    . simvastatin (ZOCOR) 40 MG tablet Take 40 mg by mouth daily.    . traZODone (DESYREL) 100 MG tablet Take 100 mg by mouth at bedtime.    . vitamin B-12 (CYANOCOBALAMIN) 1000 MCG tablet Take 1,000 mcg by mouth daily.     No current facility-administered medications for this visit.    OBJECTIVE: Filed Vitals:   09/17/15 1034  BP: 180/91  Pulse: 82  Temp: 96.4 F (35.8 C)  Resp: 16     Body mass index is 25.86 kg/(m^2).    ECOG FS:0 - Asymptomatic  General: Well-developed, well-nourished, no acute distress. Eyes: Pink conjunctiva, anicteric sclera. Lungs: Clear to auscultation bilaterally. Heart: Regular rate and rhythm. No rubs, murmurs, or gallops. Abdomen: Soft, nontender, nondistended. No organomegaly noted, normoactive bowel sounds. Musculoskeletal: No edema, cyanosis, or clubbing. Neuro: Alert,  answering all questions appropriately. Cranial nerves grossly intact. Skin: No rashes or petechiae noted. Psych: Normal affect.   LAB RESULTS:  Lab Results  Component Value Date   NA 143 01/27/2013   K 3.7 01/27/2013   CL 109* 01/27/2013   CO2 26 01/27/2013   GLUCOSE 113* 01/27/2013   BUN 12 01/27/2013   CREATININE 0.91 01/27/2013   CALCIUM 7.8* 01/27/2013   GFRNONAA >60 01/27/2013   GFRAA >60 01/27/2013    Lab Results  Component Value Date   WBC 3.1* 09/17/2015   NEUTROABS 2.0 09/17/2015    HGB 11.2* 09/17/2015   HCT 33.6* 09/17/2015   MCV 104.0* 09/17/2015   PLT 48* 09/17/2015     STUDIES: No results found.  ASSESSMENT: Pancytopenia.  PLAN:   1.  Pancytopenia: Patient's blood counts are all decreased, but approximately his baseline and unchanged for several years.  Previously, the remainder of his laboratory work was negative or within normal limits.  He does have some mild splenomegaly at 15 cm.  This is contributing, but likely does not entirely explain his thrombocytopenia. He possibly has underlying MDS.  Patient does not wish to have a bone marrow biopsy at this time, but understands that one may be necessary in the future.  Return to clinic in 6 months for laboratory work and further evaluation.   Patient expressed understanding and was in agreement with this plan. He also understands that He can call clinic at any time with any questions, concerns, or complaints.    Lloyd Huger, MD   09/26/2015 3:28 PM

## 2016-02-03 ENCOUNTER — Ambulatory Visit
Admission: RE | Admit: 2016-02-03 | Discharge: 2016-02-03 | Disposition: A | Discharge status: Home / Self Care | Payer: Medicare Other | Source: Ambulatory Visit | Attending: Physician Assistant | Admitting: Physician Assistant

## 2016-02-03 ENCOUNTER — Inpatient Hospital Stay
Admission: AD | Admit: 2016-02-03 | Discharge: 2016-02-21 | DRG: 438 | Disposition: E | Payer: Medicare Other | Source: Ambulatory Visit | Attending: Internal Medicine | Admitting: Internal Medicine

## 2016-02-03 ENCOUNTER — Other Ambulatory Visit: Payer: Self-pay | Admitting: Physician Assistant

## 2016-02-03 ENCOUNTER — Ambulatory Visit: Payer: Medicare Other

## 2016-02-03 ENCOUNTER — Inpatient Hospital Stay: Payer: Medicare Other

## 2016-02-03 DIAGNOSIS — R1084 Generalized abdominal pain: Secondary | ICD-10-CM

## 2016-02-03 DIAGNOSIS — K567 Ileus, unspecified: Secondary | ICD-10-CM | POA: Diagnosis not present

## 2016-02-03 DIAGNOSIS — I429 Cardiomyopathy, unspecified: Secondary | ICD-10-CM | POA: Diagnosis present

## 2016-02-03 DIAGNOSIS — W19XXXA Unspecified fall, initial encounter: Secondary | ICD-10-CM

## 2016-02-03 DIAGNOSIS — K5900 Constipation, unspecified: Secondary | ICD-10-CM

## 2016-02-03 DIAGNOSIS — K3189 Other diseases of stomach and duodenum: Secondary | ICD-10-CM | POA: Diagnosis present

## 2016-02-03 DIAGNOSIS — E785 Hyperlipidemia, unspecified: Secondary | ICD-10-CM | POA: Diagnosis present

## 2016-02-03 DIAGNOSIS — Z9114 Patient's other noncompliance with medication regimen: Secondary | ICD-10-CM | POA: Diagnosis not present

## 2016-02-03 DIAGNOSIS — J9601 Acute respiratory failure with hypoxia: Secondary | ICD-10-CM | POA: Diagnosis not present

## 2016-02-03 DIAGNOSIS — K7031 Alcoholic cirrhosis of liver with ascites: Secondary | ICD-10-CM | POA: Diagnosis present

## 2016-02-03 DIAGNOSIS — G9341 Metabolic encephalopathy: Secondary | ICD-10-CM | POA: Diagnosis not present

## 2016-02-03 DIAGNOSIS — Z66 Do not resuscitate: Secondary | ICD-10-CM | POA: Diagnosis not present

## 2016-02-03 DIAGNOSIS — E861 Hypovolemia: Secondary | ICD-10-CM | POA: Diagnosis present

## 2016-02-03 DIAGNOSIS — K859 Acute pancreatitis without necrosis or infection, unspecified: Secondary | ICD-10-CM | POA: Diagnosis present

## 2016-02-03 DIAGNOSIS — K7200 Acute and subacute hepatic failure without coma: Secondary | ICD-10-CM | POA: Diagnosis present

## 2016-02-03 DIAGNOSIS — E872 Acidosis, unspecified: Secondary | ICD-10-CM

## 2016-02-03 DIAGNOSIS — R34 Anuria and oliguria: Secondary | ICD-10-CM | POA: Diagnosis not present

## 2016-02-03 DIAGNOSIS — J9690 Respiratory failure, unspecified, unspecified whether with hypoxia or hypercapnia: Secondary | ICD-10-CM

## 2016-02-03 DIAGNOSIS — I10 Essential (primary) hypertension: Secondary | ICD-10-CM | POA: Diagnosis present

## 2016-02-03 DIAGNOSIS — R6521 Severe sepsis with septic shock: Secondary | ICD-10-CM | POA: Diagnosis not present

## 2016-02-03 DIAGNOSIS — E860 Dehydration: Secondary | ICD-10-CM | POA: Diagnosis present

## 2016-02-03 DIAGNOSIS — I959 Hypotension, unspecified: Secondary | ICD-10-CM | POA: Diagnosis not present

## 2016-02-03 DIAGNOSIS — Z8249 Family history of ischemic heart disease and other diseases of the circulatory system: Secondary | ICD-10-CM

## 2016-02-03 DIAGNOSIS — Z7982 Long term (current) use of aspirin: Secondary | ICD-10-CM

## 2016-02-03 DIAGNOSIS — Z87891 Personal history of nicotine dependence: Secondary | ICD-10-CM | POA: Diagnosis not present

## 2016-02-03 DIAGNOSIS — E2740 Unspecified adrenocortical insufficiency: Secondary | ICD-10-CM | POA: Diagnosis present

## 2016-02-03 DIAGNOSIS — E876 Hypokalemia: Secondary | ICD-10-CM | POA: Diagnosis not present

## 2016-02-03 DIAGNOSIS — E8770 Fluid overload, unspecified: Secondary | ICD-10-CM | POA: Diagnosis present

## 2016-02-03 DIAGNOSIS — N170 Acute kidney failure with tubular necrosis: Secondary | ICD-10-CM | POA: Diagnosis not present

## 2016-02-03 DIAGNOSIS — N179 Acute kidney failure, unspecified: Secondary | ICD-10-CM | POA: Diagnosis not present

## 2016-02-03 DIAGNOSIS — K852 Alcohol induced acute pancreatitis without necrosis or infection: Secondary | ICD-10-CM | POA: Diagnosis present

## 2016-02-03 DIAGNOSIS — Z01818 Encounter for other preprocedural examination: Secondary | ICD-10-CM

## 2016-02-03 DIAGNOSIS — D65 Disseminated intravascular coagulation [defibrination syndrome]: Secondary | ICD-10-CM | POA: Diagnosis present

## 2016-02-03 DIAGNOSIS — I468 Cardiac arrest due to other underlying condition: Secondary | ICD-10-CM | POA: Diagnosis not present

## 2016-02-03 DIAGNOSIS — Z79899 Other long term (current) drug therapy: Secondary | ICD-10-CM

## 2016-02-03 DIAGNOSIS — K766 Portal hypertension: Secondary | ICD-10-CM | POA: Diagnosis present

## 2016-02-03 DIAGNOSIS — A419 Sepsis, unspecified organism: Secondary | ICD-10-CM | POA: Diagnosis not present

## 2016-02-03 DIAGNOSIS — J189 Pneumonia, unspecified organism: Secondary | ICD-10-CM | POA: Diagnosis not present

## 2016-02-03 DIAGNOSIS — F10231 Alcohol dependence with withdrawal delirium: Secondary | ICD-10-CM | POA: Diagnosis not present

## 2016-02-03 DIAGNOSIS — R579 Shock, unspecified: Secondary | ICD-10-CM

## 2016-02-03 DIAGNOSIS — E1151 Type 2 diabetes mellitus with diabetic peripheral angiopathy without gangrene: Secondary | ICD-10-CM | POA: Diagnosis present

## 2016-02-03 DIAGNOSIS — E875 Hyperkalemia: Secondary | ICD-10-CM | POA: Diagnosis present

## 2016-02-03 DIAGNOSIS — R578 Other shock: Secondary | ICD-10-CM | POA: Diagnosis not present

## 2016-02-03 DIAGNOSIS — Z515 Encounter for palliative care: Secondary | ICD-10-CM | POA: Diagnosis not present

## 2016-02-03 DIAGNOSIS — D62 Acute posthemorrhagic anemia: Secondary | ICD-10-CM | POA: Diagnosis not present

## 2016-02-03 DIAGNOSIS — R1115 Cyclical vomiting syndrome unrelated to migraine: Secondary | ICD-10-CM

## 2016-02-03 DIAGNOSIS — I5021 Acute systolic (congestive) heart failure: Secondary | ICD-10-CM | POA: Diagnosis not present

## 2016-02-03 DIAGNOSIS — R0600 Dyspnea, unspecified: Secondary | ICD-10-CM

## 2016-02-03 DIAGNOSIS — R310 Gross hematuria: Secondary | ICD-10-CM | POA: Diagnosis not present

## 2016-02-03 DIAGNOSIS — E871 Hypo-osmolality and hyponatremia: Secondary | ICD-10-CM | POA: Diagnosis present

## 2016-02-03 DIAGNOSIS — K8010 Calculus of gallbladder with chronic cholecystitis without obstruction: Secondary | ICD-10-CM | POA: Diagnosis present

## 2016-02-03 DIAGNOSIS — J80 Acute respiratory distress syndrome: Secondary | ICD-10-CM | POA: Diagnosis not present

## 2016-02-03 DIAGNOSIS — I8510 Secondary esophageal varices without bleeding: Secondary | ICD-10-CM | POA: Diagnosis present

## 2016-02-03 DIAGNOSIS — Z4659 Encounter for fitting and adjustment of other gastrointestinal appliance and device: Secondary | ICD-10-CM

## 2016-02-03 LAB — CREATININE, SERUM
CREATININE: 5.56 mg/dL — AB (ref 0.61–1.24)
GFR calc Af Amer: 11 mL/min — ABNORMAL LOW (ref >60)
GFR, EST NON AFRICAN AMERICAN: 10 mL/min — AB (ref >60)

## 2016-02-03 LAB — CBC
HCT: 34.6 % — ABNORMAL LOW (ref 40.0–52.0)
HEMOGLOBIN: 11.4 g/dL — AB (ref 13.0–18.0)
MCH: 34.1 pg — ABNORMAL HIGH (ref 26.0–34.0)
MCHC: 33 g/dL (ref 32.0–36.0)
MCV: 103.4 fL — ABNORMAL HIGH (ref 80.0–100.0)
PLATELETS: 91 10*3/uL — AB (ref 150–440)
RBC: 3.35 MIL/uL — AB (ref 4.40–5.90)
RDW: 15.6 % — ABNORMAL HIGH (ref 11.5–14.5)
WBC: 9.1 10*3/uL (ref 3.8–10.6)

## 2016-02-03 MED ORDER — MORPHINE SULFATE (PF) 2 MG/ML IV SOLN
2.0000 mg | INTRAVENOUS | Status: DC | PRN
  Administered 2016-02-03: 2 mg via INTRAVENOUS
  Filled 2016-02-03: qty 1

## 2016-02-03 MED ORDER — HEPARIN SODIUM (PORCINE) 5000 UNIT/ML IJ SOLN
5000.0000 [IU] | Freq: Three times a day (TID) | INTRAMUSCULAR | Status: DC
  Administered 2016-02-03: 17:00:00 5000 [IU] via SUBCUTANEOUS
  Filled 2016-02-03: qty 1

## 2016-02-03 MED ORDER — SODIUM POLYSTYRENE SULFONATE 15 GM/60ML PO SUSP: 30.0000 g | Freq: Once | ORAL | Status: DC

## 2016-02-03 MED ORDER — ASPIRIN EC 81 MG PO TBEC: 81.0000 mg | DELAYED_RELEASE_TABLET | Freq: Every day | ORAL | Status: DC

## 2016-02-03 MED ORDER — SODIUM CHLORIDE 0.9 % IV SOLN
INTRAVENOUS | Status: DC
  Administered 2016-02-03: 18:00:00 via INTRAVENOUS

## 2016-02-03 MED ORDER — VITAMIN B-12 1000 MCG PO TABS
1000.0000 ug | ORAL_TABLET | Freq: Every day | ORAL | Status: DC
  Administered 2016-02-03: 1000 ug via ORAL
  Filled 2016-02-03: qty 1

## 2016-02-03 MED ORDER — TRAZODONE HCL 100 MG PO TABS
100.0000 mg | ORAL_TABLET | Freq: Every day | ORAL | Status: DC
Start: 2016-02-03 — End: 2016-02-04
  Administered 2016-02-03: 20:00:00 100 mg via ORAL
  Filled 2016-02-03: qty 1

## 2016-02-03 MED ORDER — ONDANSETRON HCL 4 MG/2ML IJ SOLN: 4.0000 mg | Freq: Four times a day (QID) | INTRAMUSCULAR | Status: DC | PRN

## 2016-02-03 MED ORDER — SIMVASTATIN 40 MG PO TABS
40.0000 mg | ORAL_TABLET | Freq: Every day | ORAL | Status: DC
  Administered 2016-02-03: 20:00:00 40 mg via ORAL
  Filled 2016-02-03: qty 1

## 2016-02-03 NOTE — Consult Note (Signed)
GI Inpatient Consult Note  Reason for Consult: Pancreatitis   Attending Requesting Consult: Dr. Elisabeth PigeonVachhani  History of Present Illness: Jay Mejia is a 65 y.o. male with a significant pmh of alcoholic cirrhosis, fibrosis F4, esophageal varices presented to his PCP today with a chief complaint of abdominal pain, nausea and vomiting since Saturday.  Patients reported to PCP that he had been drinking vodka Friday night.    His labs were elevated with lipase of 816, BUN 51/ creatinine 4.7, GFR 13 (were WNL 11 days ago).  Sent for direct admit.  He reports that he has to drink alcohol to help him sleep, that sleeping pills do not work. He reports that the abdominal pain started Friday night, with nausea and vomiting.  He was able to eat cheerios and milk this morning.    He had an episode of vomiting while I was in the room with him.  He denies seeing any blood.  He has never had an episode of pancreatitis before.  His is followed by Dr. Bluf[facility]h at [facility] for cirrhosis.  Last EGD was 03/24/2015 with Grade I varices were found in lower third of esophagus.  No more banding was needed at that time.  Mild portal hypertensive gastropathy was found in the gastric fundus.  Patient reports he is no longer taking the Xifaxin due to cost and does not want to take lactulose due to side effect of diarrhea.   Past Medical History:  Past Medical History  Diagnosis Date  . Hypertension   . Diabetes mellitus without complication (HCC)   . 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 [facility](HCC)     Problem List: Patient Active Problem List   Diagnosis Date Noted  . Acute renal failure (HCC) 02/01/2016  . Pancreatitis, acute 02/17/2016  . Hyperkalemia 02/13/2016  . Acute pancreatitis 02/16/2016    Past Surgical History: Past Surgical History  Procedure Laterality Date  . Carotid endarterectomy    . Tonsillectomy    . Esophagogastroduodenoscopy N/A 03/24/2015    Procedure: ESOPHAGOGASTRODUODENOSCOPY (EGD);   Surgeon: Wallace CullensPaul Y Oh, MD;  [facility];  Service: Gastroenterology;  Laterality: N/A;    Allergies: No Known Allergies  Home Medications: Prescriptions prior to admission  Medication Sig Dispense Refill Last Dose  . aspirin EC 81 MG tablet Take 81 mg by mouth daily.   01/25/2016  . enalapril (VASOTEC) 5 MG tablet Take 5 mg by mouth daily.   02/16/2016  . metFORMIN (GLUCOPHAGE) 1000 MG tablet Take 1,000 mg by mouth 2 (two) times daily with a meal.   02/16/2016  . nadolol (CORGARD) 40 MG tablet Take 40 mg by mouth daily.   01/30/2016  . omeprazole (PRILOSEC) 20 MG capsule Take 20 mg by mouth daily.   02/12/2016  . simvastatin (ZOCOR) 40 MG tablet Take 40 mg by mouth daily.   02/02/2016  . traZODone (DESYREL) 100 MG tablet Take 100 mg by mouth at bedtime.   01/21/2016  . vitamin B-12 (CYANOCOBALAMIN) 1000 MCG tablet Take 1,000 mcg by mouth daily.   02/07/2016  . ALPRAZolam (XANAX) 0.25 MG tablet Take 0.25 mg by mouth at bedtime as needed for sleep.      Home medication reconciliation was completed with the patient.   Scheduled Inpatient Medications:   . heparin  5,000 Units Subcutaneous 3 times per day    Continuous Inpatient Infusions:   . sodium chloride      PRN Inpatient Medications:  morphine injection, ondansetron (ZOFRAN) IV  Family History:  family history includes Hypertension in his father.    Social History:   reports that he has quit smoking. He does not have any smokeless tobacco history on file.   Review of Systems: Constitutional: Weight is stable.  Eyes: No changes in vision. ENT: No oral lesions, sore throat.  GI: see HPI.  Heme/Lymph: No easy bruising.  CV: No chest pain.  GU: No hematuria.  Integumentary: No rashes.  Neuro: No headaches.  Psych: No depression/anxiety.  Endocrine: No heat/cold intolerance.  Allergic/Immunologic: No urticaria.  Resp: No cough, SOB.  Musculoskeletal: No joint swelling.    Physical Examination: BP 101/48 mmHg   Pulse 68  Temp(Src) 97.8 F (36.6 C) (Oral)  Resp 20  SpO2 100% Gen:  alert and oriented x 4, in mild-moderate distress, wife is present and helps with history HEENT: PEERLA, EOMI, Neck: supple, no JVD or thyromegaly Chest: CTA bilaterally, no wheezes, crackles, or other adventitious sounds CV: RRR, no m/g/c/r Abd: distended, generalized tenderness, +BS in all four quadrants; no HSM, guarding, ridigity, or rebound tenderness Ext: no edema, well perfused with 2+ pulses, Skin: no rash or lesions noted Lymph: no LAD  Data: Lab Results  Component Value Date   WBC 3.1* 09/17/2015   HGB 11.2* 09/17/2015   HCT 33.6* 09/17/2015   MCV 104.0* 09/17/2015   PLT 48* 09/17/2015   No results for input(s): HGB in the last 168 hours. Lab Results  Component Value Date   NA 143 01/27/2013   K 3.7 01/27/2013   CL 109* 01/27/2013   CO2 26 01/27/2013   BUN 12 01/27/2013   CREATININE 0.91 01/27/2013   No results found for: ALT, AST, GGT, ALKPHOS, BILITOT No results for input(s): APTT, INR, PTT in the last 168 hours.  Imaging: CLINICAL DATA: Acute pancreatitis  EXAM: ABDOMEN ULTRASOUND COMPLETE  COMPARISON: March 19, 2014  FINDINGS: Gallbladder: There are sludge and gallstones identified in the gallbladder. The gallbladder wall is thickened. No sonographic Murphy sign noted by sonographer.  Common bile duct: Diameter: 3 mm  Liver: No focal lesion identified. There is diffuse increased echotexture of the liver with nodular contour of the liver surface.  IVC: No abnormality visualized.  Pancreas: Not well visualized.  Spleen: Size and appearance within normal limits.  Right Kidney: Length: 12.4 cm. Echogenicity within normal limits. No mass or hydronephrosis visualized.  Left Kidney: Length: 13 cm. Echogenicity within normal limits. No mass or hydronephrosis visualized.  Abdominal aorta: No aneurysm visualized.  Other findings: Ascites is identified in all 4  quadrants.  IMPRESSION: Gallstones and sludge in the gallbladder. There is gallbladder wall thickening. The sonographer does not report a positive sonographic Murphy sign. The findings are equivocal for cholecystitis.  Diffuse increased echotexture of the liver, nonspecific but can be seen in fatty infiltration of liver. There is a nodular contour for of the liver surface suggesting changes of cirrhosis.  Ascites.   Electronically Signed  By: Sherian Rein M.D.  On: 02-27-2016 16:25  Assessment/Plan: Mr. Zahradnik is a 65 y.o. male with pmh of alcoholic cirrhosis, fibrosis F4, His labs were elevated with lipase of 816, BUN 551/ creatinine 4.7, GFR 13 (were WNL 11 days ago).  Hyponatremia, hyperkalemia [facility] labs are still pending)  Recommendations: When labs are drawn, recommend including PT/INR, albumin.  Continue to keep NPO, control pain and give fluids.  We will continue to follow with you. Thank you for the consult. Please call with questions or concerns.  Carney Harder, PA-C  I personally  performed these services.

## 2016-02-03 NOTE — Consult Note (Signed)
Patient with acute renal failure, known cirrhosis, grade 1 varices, sludge and gall stones in gall bladder.  Nausea and vomiting for 4 days, elevated lipase indicating pancreatitis likely, possible alcohol origin and possible gall stones.  Exam shows chest clear, heart RRR, abd with enlarged liver, bowel sounds ok.    A: pancreatitis with elevated lipase, renal failure from dehydration, known cirrhosis.  Last EGD was 03/2015 with grade 1 varices. See Note from  Sung Amabileari Richards for complete consult note.

## 2016-02-03 NOTE — H&P (Signed)
[facility] [place] [facility]   PATIENT NAME: Jay Mejia    MR#:  [id number]  DATE OF BIRTH:  18-Aug-1951  DATE OF ADMISSION:  02/18/2016  PRIMARY CARE PHYSICIAN: Clydie Braun, MD   REQUESTING/REFERRING PHYSICIAN: Sampson Goon  CHIEF COMPLAINT:  No chief complaint on file.   HISTORY OF PRESENT ILLNESS: Jay Mejia  is a 65 y.o. male with a known history of hypertension, diabetes, witnesses of esophagus- was having nausea and vomiting for last 4 days and was not able to eat enough. Went to PMDs office today morning, he ordered the lab work and found having acute renal failure with hyperkalemia and elevated lipase level. So sent him in for direct admission.  I reviewed labs available in Care everywhere- which are done today at [facility].  PAST MEDICAL HISTORY:   Past Medical History  Diagnosis Date  . Hypertension   . Diabetes mellitus without complication (HCC)   . 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 [facility])     PAST SURGICAL HISTORY:  Past Surgical History  Procedure Laterality Date  . Carotid endarterectomy    . Tonsillectomy    . Esophagogastroduodenoscopy N/A 03/24/2015    Procedure: ESOPHAGOGASTRODUODENOSCOPY (EGD);  Surgeon: Wallace Cullens, MD;  Location: [facility] ENDOSCOPY;  Service: Gastroenterology;  Laterality: N/A;    SOCIAL HISTORY:  Social History  Substance Use Topics  . Smoking status: Former Games developer  . Smokeless tobacco: Not on file  . Alcohol Use: Not on file    FAMILY HISTORY:  Family History  Problem Relation Age of Onset  . Hypertension Father     DRUG ALLERGIES: No Known Allergies  REVIEW OF SYSTEMS:   CONSTITUTIONAL: No fever, fatigue or weakness.  EYES: No blurred or double vision.  EARS, NOSE, AND THROAT: No tinnitus or ear pain.  RESPIRATORY: No cough, shortness of breath, wheezing or hemoptysis.  CARDIOVASCULAR: No chest pain, orthopnea, edema.  GASTROINTESTINAL:positive nausea, vomiting, no diarrhea, mild  abdominal pain.  GENITOURINARY: No dysuria, hematuria.  ENDOCRINE: No polyuria, nocturia,  HEMATOLOGY: No anemia, easy bruising or bleeding SKIN: No rash or lesion. MUSCULOSKELETAL: No joint pain or arthritis.   NEUROLOGIC: No tingling, numbness, weakness.  PSYCHIATRY: No anxiety or depression.   MEDICATIONS AT HOME:  Prior to Admission medications   Medication Sig Start Date End Date Taking? Authorizing Provider  aspirin EC 81 MG tablet Take 81 mg by mouth daily.   Yes Historical Provider, MD  enalapril (VASOTEC) 5 MG tablet Take 5 mg by mouth daily.   Yes Historical Provider, MD  metFORMIN (GLUCOPHAGE) 1000 MG tablet Take 1,000 mg by mouth 2 (two) times daily with a meal.   Yes Historical Provider, MD  nadolol (CORGARD) 40 MG tablet Take 40 mg by mouth daily.   Yes Historical Provider, MD  omeprazole (PRILOSEC) 20 MG capsule Take 20 mg by mouth daily.   Yes Historical Provider, MD  simvastatin (ZOCOR) 40 MG tablet Take 40 mg by mouth daily.   Yes Historical Provider, MD  traZODone (DESYREL) 100 MG tablet Take 100 mg by mouth at bedtime.   Yes Historical Provider, MD  vitamin B-12 (CYANOCOBALAMIN) 1000 MCG tablet Take 1,000 mcg by mouth daily.   Yes Historical Provider, MD  ALPRAZolam Prudy Feeler) 0.25 MG tablet Take 0.25 mg by mouth at bedtime as needed for sleep.    Historical Provider, MD      PHYSICAL EXAMINATION:   VITAL SIGNS: Blood pressure 101/48, pulse 68, temperature 97.8 F (36.6 C), temperature source  Oral, resp. rate 20, SpO2 100 %.  GENERAL:  65 y.o.-year-old patient lying in the bed with no acute distress.  EYES: Pupils equal, round, reactive to light and accommodation. No scleral icterus. Extraocular muscles intact.  HEENT: Head atraumatic, normocephalic. Oropharynx and nasopharynx clear.  NECK:  Supple, no jugular venous distention. No thyroid enlargement, no tenderness.  LUNGS: Normal breath sounds bilaterally, no wheezing, rales,rhonchi or crepitation. No use of  accessory muscles of respiration.  CARDIOVASCULAR: S1, S2 normal. No murmurs, rubs, or gallops.  ABDOMEN: Soft, nontender, nondistended. Bowel sounds present. No organomegaly or mass.  EXTREMITIES: No pedal edema, cyanosis, or clubbing.  NEUROLOGIC: Cranial nerves II through XII are intact. Muscle strength 5/5 in all extremities. Sensation intact. Gait not checked.  PSYCHIATRIC: The patient is alert and oriented x 3.  SKIN: No obvious rash, lesion, or ulcer.   LABORATORY PANEL:   CBC No results for input(s): WBC, HGB, HCT, PLT, MCV, MCH, MCHC, RDW, LYMPHSABS, MONOABS, EOSABS, BASOSABS, BANDABS in the last 168 hours.  Invalid input(s): NEUTRABS, BANDSABD ------------------------------------------------------------------------------------------------------------------  Chemistries  No results for input(s): NA, K, CL, CO2, GLUCOSE, BUN, CREATININE, CALCIUM, MG, AST, ALT, ALKPHOS, BILITOT in the last 168 hours.  Invalid input(s): GFRCGP ------------------------------------------------------------------------------------------------------------------ CrCl cannot be calculated (Unknown ideal weight.). ------------------------------------------------------------------------------------------------------------------ No results for input(s): TSH, T4TOTAL, T3FREE, THYROIDAB in the last 72 hours.  Invalid input(s): FREET3   Coagulation profile No results for input(s): INR, PROTIME in the last 168 hours. ------------------------------------------------------------------------------------------------------------------- No results for input(s): DDIMER in the last 72 hours. -------------------------------------------------------------------------------------------------------------------  Cardiac Enzymes No results for input(s): CKMB, TROPONINI, MYOGLOBIN in the last 168 hours.  Invalid input(s):  CK ------------------------------------------------------------------------------------------------------------------ Invalid input(s): POCBNP  ---------------------------------------------------------------------------------------------------------------  Urinalysis No results found for: COLORURINE, APPEARANCEUR, LABSPEC, PHURINE, GLUCOSEU, HGBUR, BILIRUBINUR, KETONESUR, PROTEINUR, UROBILINOGEN, NITRITE, LEUKOCYTESUR   RADIOLOGY: No results found.  EKG: No orders found for this or any previous visit.  IMPRESSION AND PLAN:  * Ac pancreatitis   IV fluids, pain meds, NPO ex meds, GI consult, US abd.  [facility]. ( available in care everywhere)  * Ac renal failure   IV fluids, Nephro consult   Avoid nephro toxic  * Hyperkalemia   Kayexalate one dose.  * Hypertension   Hold meds for now.  * Hyperlipidemia   Check lipid panel for TG   Cont statin  All the records are reviewed and case discussed with ED provider. Management plans discussed with the patient, family and they are in agreement.  CODE STATUS: Full    Code Status Orders        Start     Ordered   July 07, 2016 1408  Full code   Continuous     July 07, 2016 1407    Code Status History    Date Active Date Inactive Code Status Order ID Comments User Context   This patient has a current code status but no historical code status.    Advance Directive Documentation        Most Recent Value   Type of Advance Directive  Living will   Pre-existing out of facility DNR order (yellow form or pink MOST form)     "MOST" Form in Place?         TOTAL TIME TAKING CARE OF THIS PATIENT: 50 minutes.    Altamese DillingVACHHANI, Orrie Lascano M.D on 21-Nov-2016   Between 7am to 6pm - Pager - [phone]  After 6pm go to [email or web address] - password [email or web address]  [facility] [place]  Office  [phone]  CC: Primary care physician; Clydie Braun, MD   Note: This dictation was prepared with Dragon  dictation along with smaller phrase technology. Any transcriptional errors that result from this process are unintentional.

## 2016-02-03 NOTE — Consult Note (Signed)
Date: 20-Feb-2016                  Patient Name:  Jay Mejia  MRN: [id number]  DOB: 09-18-1951  Age / Sex: 65 y.o., male         PCP: Jay Braun, MD                 Service Requesting Consult: Internal medicine                  Reason for Consult: Acute renal failure             History of Present Illness: Patient is a 65 y.o. male with medical problems of Coronary disease, cirrhosis, Esophageal varices, portal hypertensive gastropathy, type 2 diabetes, hypertension, left carotid surgery. peripheral vascular disease, who was admitted to [facility] on 02/20/16 for evaluation of abdominal pain  Patient was seen in primary care today for nausea, vomiting, abdominal pain and cramps that started Saturday No fevers or chills. Patient denies blood in the urine or stool or vomiting blood. Decreased appetite. Decrease urination. Outpatient labs from Jay Mejia system show creatinine of 4.7/BUN of 51 Jay Mejia Baseline creatinine is normal at 0.9 noted on March 3 Sodium is noted to be low at 125, potassium high at 5.6, bicarbonate low at 15.4, albumin low at 3.2, anion gap of 24.2    Medications: Outpatient medications: Prescriptions prior to admission  Medication Sig Dispense Refill Last Dose  . aspirin EC 81 MG tablet Take 81 mg by mouth daily.   2016-02-20  . enalapril (VASOTEC) 5 MG tablet Take 5 mg by mouth daily.   2016-02-20  . metFORMIN (GLUCOPHAGE) 1000 MG tablet Take 1,000 mg by mouth 2 (two) times daily with a meal.   02/20/2016  . nadolol (CORGARD) 40 MG tablet Take 40 mg by mouth daily.   02/20/2016  . omeprazole (PRILOSEC) 20 MG capsule Take 20 mg by mouth daily.   20-Feb-2016  . simvastatin (ZOCOR) 40 MG tablet Take 40 mg by mouth daily.   02/02/2016  . traZODone (DESYREL) 100 MG tablet Take 100 mg by mouth at bedtime.   01/21/2016  . vitamin B-12 (CYANOCOBALAMIN) 1000 MCG tablet Take 1,000 mcg by mouth daily.   2016/02/20  . ALPRAZolam (XANAX) 0.25 MG tablet Take 0.25 mg by mouth at  bedtime as needed for sleep.       Current medications: Current Facility-Administered Medications  Medication Dose Route Frequency Provider Last Rate Last Dose  . 0.9 %  sodium chloride infusion   Intravenous Continuous Altamese Dilling, MD      . heparin injection 5,000 Units  5,000 Units Subcutaneous 3 times per day Altamese Dilling, MD      . morphine 2 MG/ML injection 2 mg  2 mg Intravenous Q4H PRN Altamese Dilling, MD      . ondansetron (ZOFRAN) injection 4 mg  4 mg Intravenous Q6H PRN Altamese Dilling, MD      . sodium polystyrene (KAYEXALATE) 15 GM/60ML suspension 30 g  30 g Oral Once Altamese Dilling, MD          Allergies: No Known Allergies    Past Medical History: Past Medical History  Diagnosis Date  . Hypertension   . Diabetes mellitus without complication (HCC)   . 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 [facility])      Past Surgical History: Past Surgical History  Procedure Laterality Date  . Carotid endarterectomy    . Tonsillectomy    .  Esophagogastroduodenoscopy N/A 03/24/2015    Procedure: ESOPHAGOGASTRODUODENOSCOPY (EGD);  Surgeon: Wallace CullensPaul Y Oh, MD;  [facility];  Service: Gastroenterology;  Laterality: N/A;     Family History: History reviewed. No pertinent family history.   Social History: Social History   Social History  . Marital Status: Married    Spouse Name: N/A  . Number of Children: N/A  . Years of Education: N/A   Occupational History  . Not on file.   Social History Main Topics  . Smoking status: Former Games developermoker  . Smokeless tobacco: Not on file  . Alcohol Use: Not on file  . Drug Use: Not on file  . Sexual Activity: Not on file   Other Topics Concern  . Not on file   Social History Narrative   Patient is married. He has 3 kids He is retired as Warehouse managervice president of the [employer] He quit smoking in 2005 He drinks alcohol. 3 drinks daily every Friday and Saturday  Review  of Systems: Gen: Denies fevers or chills, reports malaise HEENT: Dry mouth, denies any problems with hearing CV: No chest pain at present Resp: No shortness of breath GI: Mid epigastric pain. As in history of present illness GU : Decreased frequency and volume of voiding MS: Denies acute complaints Derm:  Denies acute complaints Psych: Denies acute complaints Heme: Denies acute complaints Neuro: Denies any problems Endocrine. Denies any problems  Vital Signs: Blood pressure 101/48, pulse 68, temperature 97.8 F (36.6 C), temperature source Oral, resp. rate 20, SpO2 100 %.  No intake or output data in the 24 hours ending 02/08/2016 1528  Weight trends: There were no vitals filed for this visit.  Physical Exam: General:  Anxious appearing, no acute distress   HEENT Dry oral mucous membranes   Neck:  Supple, no masses   Lungs: Normal effort, decreased breath sounds at bases, no crackles   Heart::  Tachycardic, no rub or gallop   Abdomen: Soft, mild diffuse tenderness in the midepigastric region   Extremities:  1+ pitting edema   Neurologic: Alert, oriented, able to answer questions   Skin: No acute rashes   Access: -  Foley: -       Lab results: Basic Metabolic Panel: No results for input(s): NA, K, CL, CO2, GLUCOSE, BUN, CREATININE, CALCIUM, MG, PHOS in the last 168 hours.  Liver Function Tests: No results for input(s): AST, ALT, ALKPHOS, BILITOT, PROT, ALBUMIN in the last 168 hours. No results for input(s): LIPASE, AMYLASE in the last 168 hours. No results for input(s): AMMONIA in the last 168 hours.  CBC: No results for input(s): WBC, NEUTROABS, HGB, HCT, MCV, PLT in the last 168 hours.  Cardiac Enzymes: No results for input(s): CKTOTAL, TROPONINI in the last 168 hours.  BNP: Invalid input(s): POCBNP  CBG: No results for input(s): GLUCAP in the last 168 hours.  Microbiology: No results found for this or any previous visit (from the past 720 hour(s)).    Coagulation Studies: No results for input(s): LABPROT, INR in the last 72 hours.  Urinalysis: No results for input(s): COLORURINE, LABSPEC, PHURINE, GLUCOSEU, HGBUR, BILIRUBINUR, KETONESUR, PROTEINUR, UROBILINOGEN, NITRITE, LEUKOCYTESUR in the last 72 hours.  Invalid input(s): APPERANCEUR      Imaging:  No results found.   Assessment & Plan: Pt is a 65 y.o. yo male with a PMHX of Coronary disease, cirrhosis, Esophageal varices, portal hypertensive gastropathy, type 2 diabetes, hypertension, left carotid surgery. peripheral vascular disease, was admitted on 02/16/2016 with abdominal  pain.   1. Acute renal failure. Baseline Cr 0.9 With high anion gap acidosis Patient has a history of alcohol use and pancreatitis suspected Differential diagnoses of acute renal failure includes prerenal, ATN versus postrenal If his serum creatinine does not improve with IV fluids, will pursue further workup Would obtain urine protein/creatinine ratio tonight and urinalysis  2. High anion gap acidosis Impression diagnoses include lactic acidosis as patient is on metformin Would recommend holding metformin  3. Hyperkalemia Potassium is expected to improve with IV hydration Would hold administrating Kayexalate in face of current GI problems

## 2016-02-04 ENCOUNTER — Inpatient Hospital Stay (HOSPITAL_COMMUNITY)
Admission: AD | Admit: 2016-02-04 | Discharge: 2016-02-04 | Disposition: A | Discharge status: Home / Self Care | Payer: Medicare Other | Source: Ambulatory Visit | Attending: Pulmonary Disease | Admitting: Pulmonary Disease

## 2016-02-04 ENCOUNTER — Inpatient Hospital Stay: Payer: Medicare Other

## 2016-02-04 DIAGNOSIS — N179 Acute kidney failure, unspecified: Secondary | ICD-10-CM

## 2016-02-04 DIAGNOSIS — R579 Shock, unspecified: Secondary | ICD-10-CM

## 2016-02-04 DIAGNOSIS — E872 Acidosis, unspecified: Secondary | ICD-10-CM

## 2016-02-04 DIAGNOSIS — J9601 Acute respiratory failure with hypoxia: Secondary | ICD-10-CM

## 2016-02-04 DIAGNOSIS — J80 Acute respiratory distress syndrome: Secondary | ICD-10-CM

## 2016-02-04 DIAGNOSIS — I5021 Acute systolic (congestive) heart failure: Secondary | ICD-10-CM | POA: Diagnosis not present

## 2016-02-04 DIAGNOSIS — E875 Hyperkalemia: Secondary | ICD-10-CM

## 2016-02-04 DIAGNOSIS — R578 Other shock: Secondary | ICD-10-CM

## 2016-02-04 LAB — BASIC METABOLIC PANEL
Anion gap: 18 — ABNORMAL HIGH (ref 5–15)
BUN: 64 mg/dL — ABNORMAL HIGH (ref 6–20)
CHLORIDE: 100 mmol/L — AB (ref 101–111)
CO2: 10 mmol/L — AB (ref 22–32)
CREATININE: 5.74 mg/dL — AB (ref 0.61–1.24)
Calcium: 6.8 mg/dL — ABNORMAL LOW (ref 8.9–10.3)
GFR calc non Af Amer: 9 mL/min — ABNORMAL LOW (ref >60)
GFR, EST AFRICAN AMERICAN: 11 mL/min — AB (ref >60)
Glucose, Bld: 70 mg/dL (ref 65–99)
POTASSIUM: 6.3 mmol/L — AB (ref 3.5–5.1)
SODIUM: 128 mmol/L — AB (ref 135–145)

## 2016-02-04 LAB — CBC
HCT: 27.8 % — ABNORMAL LOW (ref 40.0–52.0)
HEMATOCRIT: 29.2 % — AB (ref 40.0–52.0)
HEMATOCRIT: 30.8 % — AB (ref 40.0–52.0)
HEMOGLOBIN: 9.8 g/dL — AB (ref 13.0–18.0)
Hemoglobin: 9.9 g/dL — ABNORMAL LOW (ref 13.0–18.0)
Hemoglobin: 8.7 g/dL — ABNORMAL LOW (ref 13.0–18.0)
MCH: 35 pg — AB (ref 26.0–34.0)
MCH: 33.5 pg (ref 26.0–34.0)
MCH: 34.3 pg — AB (ref 26.0–34.0)
MCHC: 33.5 g/dL (ref 32.0–36.0)
MCHC: 32.3 g/dL (ref 32.0–36.0)
MCHC: 31.3 g/dL — ABNORMAL LOW (ref 32.0–36.0)
MCV: 104.5 fL — AB (ref 80.0–100.0)
MCV: 103.8 fL — ABNORMAL HIGH (ref 80.0–100.0)
MCV: 109.6 fL — ABNORMAL HIGH (ref 80.0–100.0)
PLATELETS: 81 10*3/uL — AB (ref 150–440)
PLATELETS: 80 10*3/uL — AB (ref 150–440)
PLATELETS: 54 10*3/uL — AB (ref 150–440)
RBC: 2.79 MIL/uL — AB (ref 4.40–5.90)
RBC: 2.97 MIL/uL — ABNORMAL LOW (ref 4.40–5.90)
RBC: 2.53 MIL/uL — AB (ref 4.40–5.90)
RDW: 15.9 % — ABNORMAL HIGH (ref 11.5–14.5)
RDW: 16.2 % — AB (ref 11.5–14.5)
RDW: 17.3 % — AB (ref 11.5–14.5)
WBC: 9.7 10*3/uL (ref 3.8–10.6)
WBC: 12.5 10*3/uL — ABNORMAL HIGH (ref 3.8–10.6)
WBC: 9.5 10*3/uL (ref 3.8–10.6)

## 2016-02-04 LAB — HEPATIC FUNCTION PANEL
ALBUMIN: 2.5 g/dL — AB (ref 3.5–5.0)
ALK PHOS: 105 U/L (ref 38–126)
ALT: 17 U/L (ref 17–63)
AST: 33 U/L (ref 15–41)
BILIRUBIN DIRECT: 1 mg/dL — AB (ref 0.1–0.5)
BILIRUBIN TOTAL: 1.7 mg/dL — AB (ref 0.3–1.2)
Indirect Bilirubin: 0.7 mg/dL (ref 0.3–0.9)
Total Protein: 6.2 g/dL — ABNORMAL LOW (ref 6.5–8.1)

## 2016-02-04 LAB — RENAL FUNCTION PANEL
ALBUMIN: 2.2 g/dL — AB (ref 3.5–5.0)
ALBUMIN: 1.9 g/dL — AB (ref 3.5–5.0)
ANION GAP: 20 — AB (ref 5–15)
ANION GAP: 20 — AB (ref 5–15)
Albumin: 2.9 g/dL — ABNORMAL LOW (ref 3.5–5.0)
Albumin: 2.9 g/dL — ABNORMAL LOW (ref 3.5–5.0)
Anion gap: 14 (ref 5–15)
Anion gap: 18 — ABNORMAL HIGH (ref 5–15)
BUN: 67 mg/dL — ABNORMAL HIGH (ref 6–20)
BUN: 62 mg/dL — AB (ref 6–20)
BUN: 54 mg/dL — ABNORMAL HIGH (ref 6–20)
BUN: 50 mg/dL — AB (ref 6–20)
CALCIUM: 6.7 mg/dL — AB (ref 8.9–10.3)
CALCIUM: 7.3 mg/dL — AB (ref 8.9–10.3)
CALCIUM: 7 mg/dL — AB (ref 8.9–10.3)
CHLORIDE: 101 mmol/L (ref 101–111)
CHLORIDE: 98 mmol/L — AB (ref 101–111)
CO2: 8 mmol/L — AB (ref 22–32)
CO2: 13 mmol/L — ABNORMAL LOW (ref 22–32)
CO2: 12 mmol/L — AB (ref 22–32)
CO2: 11 mmol/L — ABNORMAL LOW (ref 22–32)
CREATININE: 5.32 mg/dL — AB (ref 0.61–1.24)
CREATININE: 4.69 mg/dL — AB (ref 0.61–1.24)
Calcium: 6.9 mg/dL — ABNORMAL LOW (ref 8.9–10.3)
Chloride: 102 mmol/L (ref 101–111)
Chloride: 105 mmol/L (ref 101–111)
Creatinine, Ser: 5.81 mg/dL — ABNORMAL HIGH (ref 0.61–1.24)
Creatinine, Ser: 4.44 mg/dL — ABNORMAL HIGH (ref 0.61–1.24)
GFR calc Af Amer: 12 mL/min — ABNORMAL LOW (ref >60)
GFR calc Af Amer: 14 mL/min — ABNORMAL LOW (ref >60)
GFR calc Af Amer: 15 mL/min — ABNORMAL LOW (ref >60)
GFR calc non Af Amer: 12 mL/min — ABNORMAL LOW (ref >60)
GFR, EST AFRICAN AMERICAN: 11 mL/min — AB (ref >60)
GFR, EST NON AFRICAN AMERICAN: 9 mL/min — AB (ref >60)
GFR, EST NON AFRICAN AMERICAN: 10 mL/min — AB (ref >60)
GFR, EST NON AFRICAN AMERICAN: 13 mL/min — AB (ref >60)
GLUCOSE: 128 mg/dL — AB (ref 65–99)
GLUCOSE: 119 mg/dL — AB (ref 65–99)
Glucose, Bld: 79 mg/dL (ref 65–99)
Glucose, Bld: 109 mg/dL — ABNORMAL HIGH (ref 65–99)
PHOSPHORUS: 8 mg/dL — AB (ref 2.5–4.6)
PHOSPHORUS: 8.8 mg/dL — AB (ref 2.5–4.6)
POTASSIUM: 5.4 mmol/L — AB (ref 3.5–5.1)
Phosphorus: 8.6 mg/dL — ABNORMAL HIGH (ref 2.5–4.6)
Phosphorus: 8.3 mg/dL — ABNORMAL HIGH (ref 2.5–4.6)
Potassium: 6.8 mmol/L (ref 3.5–5.1)
Potassium: 6.5 mmol/L — ABNORMAL HIGH (ref 3.5–5.1)
Potassium: 5.8 mmol/L — ABNORMAL HIGH (ref 3.5–5.1)
SODIUM: 130 mmol/L — AB (ref 135–145)
SODIUM: 132 mmol/L — AB (ref 135–145)
SODIUM: 131 mmol/L — AB (ref 135–145)
Sodium: 129 mmol/L — ABNORMAL LOW (ref 135–145)

## 2016-02-04 LAB — LIPASE, BLOOD: Lipase: 2559 U/L — ABNORMAL HIGH (ref 11–51)

## 2016-02-04 LAB — BLOOD GAS, ARTERIAL
ACID-BASE DEFICIT: 19.7 mmol/L — AB (ref 0.0–2.0)
Allens test (pass/fail): POSITIVE — AB
Bicarbonate: 11.3 mEq/L — ABNORMAL LOW (ref 21.0–28.0)
FIO2: 1
O2 SAT: 79.2 %
PCO2 ART: 49 mmHg — AB (ref 32.0–48.0)
PEEP: 10 cmH2O
PH ART: 6.97 — AB (ref 7.350–7.450)
Patient temperature: 37
RATE: 15 resp/min
VT: 520 mL
pO2, Arterial: 69 mmHg — ABNORMAL LOW (ref 83.0–108.0)

## 2016-02-04 LAB — COMPREHENSIVE METABOLIC PANEL
ALBUMIN: 2.3 g/dL — AB (ref 3.5–5.0)
ALK PHOS: 119 U/L (ref 38–126)
ALT: 21 U/L (ref 17–63)
AST: 49 U/L — AB (ref 15–41)
Anion gap: 17 — ABNORMAL HIGH (ref 5–15)
BILIRUBIN TOTAL: 1.8 mg/dL — AB (ref 0.3–1.2)
BUN: 66 mg/dL — AB (ref 6–20)
CO2: 11 mmol/L — ABNORMAL LOW (ref 22–32)
CREATININE: 5.78 mg/dL — AB (ref 0.61–1.24)
Calcium: 6.5 mg/dL — ABNORMAL LOW (ref 8.9–10.3)
Chloride: 102 mmol/L (ref 101–111)
GFR calc Af Amer: 11 mL/min — ABNORMAL LOW (ref >60)
GFR, EST NON AFRICAN AMERICAN: 9 mL/min — AB (ref >60)
GLUCOSE: 80 mg/dL (ref 65–99)
POTASSIUM: 6.7 mmol/L — AB (ref 3.5–5.1)
Sodium: 130 mmol/L — ABNORMAL LOW (ref 135–145)
TOTAL PROTEIN: 6 g/dL — AB (ref 6.5–8.1)

## 2016-02-04 LAB — MAGNESIUM
MAGNESIUM: 1.2 mg/dL — AB (ref 1.7–2.4)
MAGNESIUM: 1.4 mg/dL — AB (ref 1.7–2.4)
Magnesium: 1.2 mg/dL — ABNORMAL LOW (ref 1.7–2.4)

## 2016-02-04 LAB — LIPID PANEL
Cholesterol: 85 mg/dL (ref 0–200)
HDL: <10 mg/dL — ABNORMAL LOW (ref >40)
TRIGLYCERIDES: 150 mg/dL — AB (ref <150)
VLDL: 30 mg/dL (ref 0–40)

## 2016-02-04 LAB — PROTIME-INR
INR: 1.7
Prothrombin Time: 20 seconds — ABNORMAL HIGH (ref 11.4–15.0)

## 2016-02-04 LAB — APTT: APTT: 48 s — AB (ref 24–36)

## 2016-02-04 LAB — GLUCOSE, CAPILLARY
GLUCOSE-CAPILLARY: 107 mg/dL — AB (ref 65–99)
GLUCOSE-CAPILLARY: 112 mg/dL — AB (ref 65–99)
Glucose-Capillary: 72 mg/dL (ref 65–99)
Glucose-Capillary: 120 mg/dL — ABNORMAL HIGH (ref 65–99)

## 2016-02-04 LAB — ECHOCARDIOGRAM COMPLETE
HEIGHTINCHES: 67 in
Weight: 2737.6 oz

## 2016-02-04 LAB — LACTIC ACID, PLASMA
LACTIC ACID, VENOUS: 6.6 mmol/L — AB (ref 0.5–2.0)
LACTIC ACID, VENOUS: 7.8 mmol/L — AB (ref 0.5–2.0)

## 2016-02-04 LAB — MRSA PCR SCREENING: MRSA by PCR: NEGATIVE

## 2016-02-04 LAB — TROPONIN I
TROPONIN I: 0.37 ng/mL — AB (ref <0.031)
TROPONIN I: 0.84 ng/mL — AB (ref <0.031)
Troponin I: 0.1 ng/mL — ABNORMAL HIGH (ref <0.031)

## 2016-02-04 MED ORDER — FENTANYL CITRATE (PF) 100 MCG/2ML IJ SOLN
50.0000 ug | Freq: Once | INTRAMUSCULAR | Status: AC
  Administered 2016-02-04: 50 ug via INTRAVENOUS

## 2016-02-04 MED ORDER — SODIUM CHLORIDE 0.9 % IV BOLUS (SEPSIS)
1000.0000 mL | Freq: Once | INTRAVENOUS | Status: AC
  Administered 2016-02-04: 1000 mL via INTRAVENOUS

## 2016-02-04 MED ORDER — LORAZEPAM 2 MG/ML IJ SOLN
1.0000 mg | Freq: Once | INTRAMUSCULAR | Status: AC
  Administered 2016-02-04: 1 mg via INTRAVENOUS

## 2016-02-04 MED ORDER — SODIUM BICARBONATE 8.4 % IV SOLN
INTRAVENOUS | Status: AC
  Administered 2016-02-04: 50 meq via INTRAVENOUS
  Filled 2016-02-04: qty 50

## 2016-02-04 MED ORDER — NOREPINEPHRINE BITARTRATE 1 MG/ML IV SOLN
0.0000 ug/min | INTRAVENOUS | Status: DC
  Administered 2016-02-04: 10 ug/min via INTRAVENOUS

## 2016-02-04 MED ORDER — DEXMEDETOMIDINE HCL IN NACL 400 MCG/100ML IV SOLN
0.0000 ug/kg/h | INTRAVENOUS | Status: DC
  Filled 2016-02-04: qty 100

## 2016-02-04 MED ORDER — HEPARIN SODIUM (PORCINE) 1000 UNIT/ML DIALYSIS
1000.0000 [IU] | INTRAMUSCULAR | Status: DC | PRN
  Filled 2016-02-04: qty 6

## 2016-02-04 MED ORDER — SODIUM CHLORIDE 0.9 % IV SOLN
25.0000 ug/h | INTRAVENOUS | Status: DC
  Administered 2016-02-04: 5 ug/h via INTRAVENOUS
  Administered 2016-02-06 – 2016-02-07 (×2): 50 ug/h via INTRAVENOUS
  Filled 2016-02-04 (×4): qty 50

## 2016-02-04 MED ORDER — INSULIN ASPART 100 UNIT/ML IV SOLN
10.0000 [IU] | Freq: Once | INTRAVENOUS | Status: DC
  Filled 2016-02-04: qty 0.1

## 2016-02-04 MED ORDER — CHLORHEXIDINE GLUCONATE 0.12% ORAL RINSE (MEDLINE KIT)
15.0000 mL | Freq: Two times a day (BID) | OROMUCOSAL | Status: DC
  Administered 2016-02-04 – 2016-02-11 (×14): 15 mL via OROMUCOSAL
  Filled 2016-02-04 (×17): qty 15

## 2016-02-04 MED ORDER — LORAZEPAM 2 MG/ML IJ SOLN
INTRAMUSCULAR | Status: AC
  Administered 2016-02-04: 2 mg
  Filled 2016-02-04: qty 1

## 2016-02-04 MED ORDER — NOREPINEPHRINE BITARTRATE 1 MG/ML IV SOLN
0.0000 ug/min | INTRAVENOUS | Status: DC
  Administered 2016-02-04 – 2016-02-06 (×7): 50 ug/min via INTRAVENOUS
  Administered 2016-02-06: 51 ug/min via INTRAVENOUS
  Administered 2016-02-06: 55 ug/min via INTRAVENOUS
  Administered 2016-02-07: 50 ug/min via INTRAVENOUS
  Administered 2016-02-08: 15 ug/min via INTRAVENOUS
  Filled 2016-02-04 (×14): qty 16

## 2016-02-04 MED ORDER — ALBUMIN HUMAN 25 % IV SOLN
25.0000 g | Freq: Four times a day (QID) | INTRAVENOUS | Status: AC
  Administered 2016-02-04 – 2016-02-05 (×4): 25 g via INTRAVENOUS
  Filled 2016-02-04 (×6): qty 100

## 2016-02-04 MED ORDER — FOLIC ACID 5 MG/ML IJ SOLN
1.0000 mg | Freq: Once | INTRAMUSCULAR | Status: AC
  Administered 2016-02-04: 1 mg via INTRAVENOUS
  Filled 2016-02-04 (×2): qty 0.2

## 2016-02-04 MED ORDER — INSULIN ASPART 100 UNIT/ML ~~LOC~~ SOLN
0.0000 [IU] | SUBCUTANEOUS | Status: DC
  Administered 2016-02-05: 3 [IU] via SUBCUTANEOUS
  Administered 2016-02-05: 2 [IU] via SUBCUTANEOUS
  Administered 2016-02-05: 3 [IU] via SUBCUTANEOUS
  Administered 2016-02-06: 2 [IU] via SUBCUTANEOUS
  Administered 2016-02-06 (×3): 3 [IU] via SUBCUTANEOUS
  Administered 2016-02-07 (×3): 5 [IU] via SUBCUTANEOUS
  Administered 2016-02-07: 3 [IU] via SUBCUTANEOUS
  Administered 2016-02-07 – 2016-02-09 (×9): 5 [IU] via SUBCUTANEOUS
  Administered 2016-02-09: 8 [IU] via SUBCUTANEOUS
  Administered 2016-02-09: 5 [IU] via SUBCUTANEOUS
  Administered 2016-02-09 (×2): 8 [IU] via SUBCUTANEOUS
  Administered 2016-02-09: 5 [IU] via SUBCUTANEOUS
  Administered 2016-02-10: 11 [IU] via SUBCUTANEOUS
  Administered 2016-02-10: 8 [IU] via SUBCUTANEOUS
  Filled 2016-02-04 (×2): qty 5
  Filled 2016-02-04: qty 8
  Filled 2016-02-04: qty 3
  Filled 2016-02-04: qty 2
  Filled 2016-02-04: qty 8
  Filled 2016-02-04: qty 3
  Filled 2016-02-04 (×3): qty 5
  Filled 2016-02-04: qty 2
  Filled 2016-02-04: qty 5
  Filled 2016-02-04: qty 2
  Filled 2016-02-04: qty 5
  Filled 2016-02-04: qty 8
  Filled 2016-02-04: qty 3
  Filled 2016-02-04: qty 11
  Filled 2016-02-04: qty 5
  Filled 2016-02-04: qty 3
  Filled 2016-02-04 (×2): qty 5
  Filled 2016-02-04: qty 8
  Filled 2016-02-04 (×2): qty 5
  Filled 2016-02-04: qty 3
  Filled 2016-02-04 (×2): qty 5

## 2016-02-04 MED ORDER — NOREPINEPHRINE BITARTRATE 1 MG/ML IV SOLN
0.0000 ug/min | INTRAVENOUS | Status: DC
  Administered 2016-02-04: 50 ug/min via INTRAVENOUS
  Filled 2016-02-04: qty 16

## 2016-02-04 MED ORDER — PIPERACILLIN-TAZOBACTAM 3.375 G IVPB
3.3750 g | Freq: Three times a day (TID) | INTRAVENOUS | Status: DC
  Administered 2016-02-04 – 2016-02-11 (×21): 3.375 g via INTRAVENOUS
  Filled 2016-02-04 (×25): qty 50

## 2016-02-04 MED ORDER — CALCIUM CHLORIDE 10 % IV SOLN
INTRAVENOUS | Status: AC
  Administered 2016-02-04: 1 g via INTRAVENOUS
  Filled 2016-02-04: qty 10

## 2016-02-04 MED ORDER — VASOPRESSIN 20 UNIT/ML IV SOLN
0.0300 [IU]/min | INTRAVENOUS | Status: DC
  Administered 2016-02-04 – 2016-02-05 (×2): 0.03 [IU]/min via INTRAVENOUS
  Filled 2016-02-04 (×5): qty 2

## 2016-02-04 MED ORDER — FENTANYL BOLUS VIA INFUSION
25.0000 ug | INTRAVENOUS | Status: DC | PRN
  Administered 2016-02-04 – 2016-02-08 (×3): 25 ug via INTRAVENOUS
  Filled 2016-02-04: qty 25

## 2016-02-04 MED ORDER — SODIUM CHLORIDE 0.9 % IV SOLN
1.0000 g | Freq: Once | INTRAVENOUS | Status: DC
  Filled 2016-02-04: qty 10

## 2016-02-04 MED ORDER — MIDAZOLAM HCL 2 MG/2ML IJ SOLN
1.0000 mg | INTRAMUSCULAR | Status: DC | PRN
  Administered 2016-02-04 – 2016-02-09 (×16): 1 mg via INTRAVENOUS
  Filled 2016-02-04 (×16): qty 2

## 2016-02-04 MED ORDER — PUREFLOW DIALYSIS SOLUTION
INTRAVENOUS | Status: DC
Start: 2016-02-04 — End: 2016-02-05
  Administered 2016-02-04 (×2): via INTRAVENOUS_CENTRAL

## 2016-02-04 MED ORDER — BISACODYL 10 MG RE SUPP: 10.0000 mg | Freq: Every day | RECTAL | Status: DC | PRN

## 2016-02-04 MED ORDER — NOREPINEPHRINE 4 MG/250ML-% IV SOLN
INTRAVENOUS | Status: AC
  Administered 2016-02-04: 4 mg
  Filled 2016-02-04: qty 250

## 2016-02-04 MED ORDER — PANTOPRAZOLE SODIUM 40 MG IV SOLR
40.0000 mg | Freq: Every day | INTRAVENOUS | Status: DC
  Administered 2016-02-04 – 2016-02-11 (×8): 40 mg via INTRAVENOUS
  Filled 2016-02-04 (×8): qty 40

## 2016-02-04 MED ORDER — SODIUM BICARBONATE 8.4 % IV SOLN
INTRAVENOUS | Status: AC
  Administered 2016-02-04: 12:00:00
  Filled 2016-02-04: qty 50

## 2016-02-04 MED ORDER — ANTISEPTIC ORAL RINSE SOLUTION (CORINZ)
7.0000 mL | Freq: Four times a day (QID) | OROMUCOSAL | Status: DC
  Administered 2016-02-04 – 2016-02-06 (×10): 7 mL via OROMUCOSAL
  Filled 2016-02-04 (×12): qty 7

## 2016-02-04 MED ORDER — CALCIUM CHLORIDE 10 % IV SOLN
INTRAVENOUS | Status: AC
  Administered 2016-02-04: 12:00:00
  Filled 2016-02-04: qty 10

## 2016-02-04 MED ORDER — DOPAMINE-DEXTROSE 3.2-5 MG/ML-% IV SOLN
0.0000 ug/kg/min | INTRAVENOUS | Status: DC
  Administered 2016-02-04 (×2): 20 ug/kg/min via INTRAVENOUS
  Administered 2016-02-05: 8 ug/kg/min via INTRAVENOUS
  Filled 2016-02-04 (×2): qty 250

## 2016-02-04 MED ORDER — ASPIRIN 325 MG PO TABS
325.0000 mg | ORAL_TABLET | Freq: Every day | ORAL | Status: DC
  Administered 2016-02-04: 325 mg
  Filled 2016-02-04: qty 1

## 2016-02-04 MED ORDER — EPINEPHRINE HCL 1 MG/ML IJ SOLN
0.5000 ug/min | INTRAMUSCULAR | Status: DC
  Administered 2016-02-04: 20 ug/min via INTRAVENOUS
  Administered 2016-02-04: 0.5 ug/min via INTRAVENOUS
  Administered 2016-02-05 – 2016-02-06 (×8): 20 ug/min via INTRAVENOUS
  Administered 2016-02-07: 5 ug/min via INTRAVENOUS
  Administered 2016-02-07: 10 ug/min via INTRAVENOUS
  Administered 2016-02-07: 20 ug/min via INTRAVENOUS
  Administered 2016-02-08 (×2): 10 ug/min via INTRAVENOUS
  Administered 2016-02-09: 6 ug/min via INTRAVENOUS
  Administered 2016-02-09: 15.013 ug/min via INTRAVENOUS
  Administered 2016-02-09: 17 ug/min via INTRAVENOUS
  Administered 2016-02-09: 10 ug/min via INTRAVENOUS
  Administered 2016-02-10: 20 ug/min via INTRAVENOUS
  Administered 2016-02-10: 17 ug/min via INTRAVENOUS
  Administered 2016-02-10: 18 ug/min via INTRAVENOUS
  Administered 2016-02-11 (×4): 20 ug/min via INTRAVENOUS
  Filled 2016-02-04 (×43): qty 4

## 2016-02-04 MED ORDER — VASOPRESSIN 20 UNIT/ML IV SOLN
0.0300 [IU]/min | INTRAVENOUS | Status: DC
  Filled 2016-02-04: qty 2

## 2016-02-04 MED ORDER — SODIUM BICARBONATE 8.4 % IV SOLN
50.0000 meq | Freq: Once | INTRAVENOUS | Status: AC
  Administered 2016-02-04: 50 meq via INTRAVENOUS

## 2016-02-04 MED ORDER — CALCIUM CHLORIDE 10 % IV SOLN
1.0000 g | Freq: Once | INTRAVENOUS | Status: AC
  Administered 2016-02-04: 1 g via INTRAVENOUS

## 2016-02-04 MED ORDER — IOHEXOL 240 MG/ML SOLN: 25.0000 mL | INTRAMUSCULAR | Status: DC

## 2016-02-04 MED ORDER — SODIUM BICARBONATE 8.4 % IV SOLN
INTRAVENOUS | Status: DC
  Administered 2016-02-04 – 2016-02-06 (×7): via INTRAVENOUS
  Filled 2016-02-04 (×11): qty 150

## 2016-02-04 MED ORDER — FENTANYL CITRATE (PF) 100 MCG/2ML IJ SOLN: 50.0000 ug | Freq: Once | INTRAMUSCULAR | Status: DC

## 2016-02-04 MED ORDER — HYDROCORTISONE NA SUCCINATE PF 100 MG IJ SOLR
50.0000 mg | Freq: Four times a day (QID) | INTRAMUSCULAR | Status: DC
  Administered 2016-02-04 – 2016-02-11 (×28): 50 mg via INTRAVENOUS
  Filled 2016-02-04 (×28): qty 2

## 2016-02-04 MED ORDER — FENTANYL CITRATE (PF) 100 MCG/2ML IJ SOLN
INTRAMUSCULAR | Status: AC
  Administered 2016-02-04: 50 ug via INTRAVENOUS
  Filled 2016-02-04: qty 2

## 2016-02-04 MED ORDER — MIDAZOLAM HCL 2 MG/2ML IJ SOLN
1.0000 mg | INTRAMUSCULAR | Status: DC | PRN
  Filled 2016-02-04: qty 2

## 2016-02-04 MED ORDER — THIAMINE HCL 100 MG/ML IJ SOLN
100.0000 mg | Freq: Every day | INTRAMUSCULAR | Status: DC
  Administered 2016-02-04 – 2016-02-11 (×8): 100 mg via INTRAVENOUS
  Filled 2016-02-04 (×8): qty 2

## 2016-02-04 MED ORDER — DEXTROSE 50 % IV SOLN: 25.0000 mL | Freq: Once | INTRAVENOUS | Status: DC

## 2016-02-04 MED FILL — Medication: Qty: 1 | Status: AC

## 2016-02-04 NOTE — ED Notes (Signed)
CODE BLUE Overhead CODE BLUE was called. Patient upon arrival appears to be in pulseless electrical activity with codeine managed by the hospitalist team. Airway management was necessary. Respiratory therapy had attempted previous to my evaluation. Patient does have traumatic injury to the left upper incisor. Tooth is still in the socket. Patient is biting with attempts and succinylcholine was administered by the nursing staff for muscle relaxation for intubation. The patient's previous potassium was 6.1 and is currently receiving sodium bicarbonate. The patient with adequate muscle relaxation and we are able to visualize the vocal cords with a MacIntosh blade. He had passed on a single attempt of a 8.0 endotracheal tube. The tube was verified by auscultation and positive CO2 change. Tube was taped in place. During the code the patient did have recovery of a pulse; patient plan is transfer to the intensive care unit.  Jennye MoccasinBrian S Prayan Ulin, MD 02/04/16 [phone]

## 2016-02-04 NOTE — Progress Notes (Signed)
This nurse in room to hang fluid bolus.  Fluid bolus infusing.  Pt's BP low and pt very confused.  This nurse contacted Dr. Clint GuyHower to come see pt.  Orson Apeanielle Harvy Riera, RN

## 2016-02-04 NOTE — Progress Notes (Signed)
Pt troponin noted to be elevated at .84, previous .37. Elink nurse notified with plans to notify MD. Will continue to monitor.

## 2016-02-04 NOTE — Progress Notes (Signed)
Pharmacy Antibiotic Note  Jay Mejia is a 65 y.o. male with acute renal failure requiring CRRT admitted on 01/26/2016 with severe pancreatitis.  Pharmacy has been consulted for Zosyn dosing.  Plan: Zosyn 3.375g IV q8h (4 hour infusion).  Height: 5\' 7"  (170.2 cm) Weight: 171 lb 1.6 oz (77.61 kg) IBW/kg (Calculated) : 66.1  Temp (24hrs), Avg:97.9 F (36.6 C), Min:97.8 F (36.6 C), Max:98 F (36.7 C)   Recent Labs Lab 02/01/2016 1858 02/04/16 0502 02/04/16 0905 02/04/16 1044  WBC 9.1 9.7 12.5*  --   CREATININE 5.56* 5.74* 5.78* 5.81*  LATICACIDVEN  --   --   --  6.6*    Estimated Creatinine Clearance: 11.7 mL/min (by C-G formula based on Cr of 5.81).    No Known Allergies  Antimicrobials this admission: Zosyn 3/15 >>    Dose adjustments this admission:   Microbiology results: BCx: pending TA: pending  3/15 MRSA PCR: negative  Thank you for allowing pharmacy to be a part of this patient's care.  Luisa HartChristy, Ayuub Penley D 02/04/2016 11:58 AM

## 2016-02-04 NOTE — Progress Notes (Signed)
Dr. Sheryle Hailiamond notified pt BP 78/45 manually. 1 liter NS bolus ordered. Will continue to monitor closely.

## 2016-02-04 NOTE — Progress Notes (Signed)
Pt found on the floor at 0155AM. Pt reports falling on back and hitting back of head on the floor- large swelling on right posterior head. No other injuries noted. Immediately called Dr. Anne HahnWillis who has ordered STAT CT. VS: BP 106/39, HR 67, O2 100%. IVF infusing as 150 ml/hr. No neurological changes noted. Pt states he is unsure why he fell, currently feels dizzy. Nursing suporvisor informed. Will continue to assess.

## 2016-02-04 NOTE — Progress Notes (Signed)
PHARMACY - CRITICAL CARE PROGRESS NOTE  Pharmacy Consult for CRRT Medication Adjustment   No Known Allergies  Patient Measurements: Height:  (170.2 cm) Weight: 171 lb 1.6 oz (77.61 kg) IBW/kg (Calculated) : 66.1  Vital Signs: Temp: 98 F (36.7 C) (03/15 0520) Temp Source: Oral (03/15 0235) BP: 95/52 mmHg (03/15 0844) Pulse Rate: 76 (03/15 0844) Intake/Output from previous day:   Intake/Output from this shift:   Vent settings for last 24 hours: Vent Mode:  [-] PRVC FiO2 (%):  [100 %] 100 % Set Rate:  [15 bmp] 15 bmp Vt Set:  [520 mL] 520 mL PEEP:  [5 cmH20] 5 cmH20 Plateau Pressure:  [26 cmH20] 26 cmH20  Labs:  Recent Labs  02/02/2016 1858 02/04/16 0502 02/04/16 0905 02/04/16 1044  WBC 9.1 9.7 12.5*  --   HGB 11.4* 9.8* 9.9*  --   HCT 34.6* 29.2* 30.8*  --   PLT 91* 81* 80*  --   APTT  --   --  48*  --   INR  --   --  1.70  --   CREATININE 5.56* 5.74* 5.78* 5.81*  MG  --   --   --  1.2*  PHOS  --   --   --  8.0*  ALBUMIN  --  2.5* 2.3* 2.2*  PROT  --  6.2* 6.0*  --   AST  --  33 49*  --   ALT  --  17 21  --   ALKPHOS  --  105 119  --   BILITOT  --  1.7* 1.8*  --   BILIDIR  --  1.0*  --   --   IBILI  --  0.7  --   --    Estimated Creatinine Clearance: 11.7 mL/min (by C-G formula based on Cr of 5.81).   Recent Labs  02/04/16 0848  GLUCAP 72    Microbiology: Recent Results (from the past 720 hour(s))  MRSA PCR Screening     Status: None   Collection Time: 02/04/16  8:39 AM  Result Value Ref Range Status   MRSA by PCR NEGATIVE NEGATIVE Final    Comment:        The GeneXpert MRSA Assay (FDA approved for NASAL specimens only), is one component of a comprehensive MRSA colonization surveillance program. It is not intended to diagnose MRSA infection nor to guide or monitor treatment for MRSA infections.     Medications:  Scheduled:  . antiseptic oral rinse  7 mL Mouth Rinse QID  . calcium gluconate  1 g Intravenous Once  . chlorhexidine  gluconate  15 mL Mouth Rinse BID  . dextrose  25 mL Intravenous Once  . heparin  5,000 Units Subcutaneous 3 times per day  . insulin aspart  0-15 Units Subcutaneous 6 times per day  . insulin aspart  10 Units Intravenous Once  . [START ON 02/05/2016] iohexol  25 mL Oral Q1 Hr x 2  . norepinephrine      . pantoprazole (PROTONIX) IV  40 mg Intravenous Daily  . piperacillin-tazobactam (ZOSYN)  IV  3.375 g Intravenous 3 times per day   Infusions:  . dexmedetomidine Stopped (02/04/16 0845)  . fentaNYL infusion INTRAVENOUS 5 mcg/hr (02/04/16 1140)  . norepinephrine (LEVOPHED) Adult infusion    . pureflow    .  sodium bicarbonate  infusion 1000 mL 125 mL/hr at 02/04/16 1126  . vasopressin (PITRESSIN) infusion - *FOR SHOCK*      Assessment: Pharmacy  consulted to assist in adjusting medications for CRRT in this 65 y/o M admitted with ARF and severe pancreatitis.   Plan:  No medications require adjustment for CRRT at present.   Pharmacy will continue to monitor and adjust per consult.   Jay Mejia, Jay Mejia D 02/04/2016,12:01 PM

## 2016-02-04 NOTE — Progress Notes (Signed)
eLink Physician-Brief Progress Note Patient Name: Jay BalloonJanerik B Mejia DOB: 12-Mar-1951 MRN: [id number]   Date of Service  02/04/2016  HPI/Events of Note  Multiple issues: 1. Troponin = 0.84 and 2. Sat = 79% on 100% FiO2 and PEEP = 18. Patient is a DNR. On vasopressors, therefore, can't B-Block.  eICU Interventions  Will order: 1. ASA 325 mg per tube now and Q day.  2. Increase PEEP to 20.  Continue to Trend Troponin.     Intervention Category Major Interventions: Respiratory failure - evaluation and management Intermediate Interventions: Diagnostic test evaluation  Sommer,Steven Eugene 02/04/2016, 10:36 PM

## 2016-02-04 NOTE — Progress Notes (Addendum)
Called to assess patient  At bedside - unresponsive - no palpable pulse Code blue CPR Epi x2 Bicarb x2 Return of perfusing rhythm  Critical care time 60min Full note to follow  Call  Fowle,Britt Spouse [phone]   For update - no response at this time

## 2016-02-04 NOTE — Progress Notes (Signed)
   02/04/16 0800  Clinical Encounter Type  Visited With Patient  Visit Type Code  Referral From Nurse  Consult/Referral To Chaplain  Spiritual Encounters  Spiritual Needs Prayer  Stress Factors  Patient Stress Factors Health changes;Other (Comment) (Code Blue)  Responded to Code Blue. Provided prayers and assisted with transfer of patient to ICU. Chap. Dalina Samara G. Kolyn Rozario, ext. [phone]

## 2016-02-04 NOTE — Consult Note (Signed)
PULMONARY / CRITICAL CARE MEDICINE   Name: Jay Mejia MRN: [id number] DOB: Jan 15, 1951    ADMISSION DATE:  02/05/2016 CONSULTATION DATE:  03/15  REFERRING MD: Estanislado Spire  PT PROFILE:   66 M admitted 03/14 with pancreatitis, AKI, metabolic acidosis, hyperkalemia. Suffered asystolic cardiac arrest and underwent 4-5 mins of CPR including chest compressions and HCO3 with restoration of spontaneous circulation. He was intubated in course of ACLS. On arrival to ICU, he was able to interact with me and F/C but shortly thereafter developed refractory shock and hypoxemia.   MAJOR EVENTS/TEST RESULTS: 03/14 Abd [place]: Gallstones and sludge in the gallbladder. There is gallbladder wall thickening. The sonographer does not report a positive sonographic Murphy sign. The findings are equivocal for cholecystitis. Diffuse increased echotexture of the liver, nonspecific but can be seen in fatty infiltration of liver. There is a nodular contour for of the liver surface suggesting changes of cirrhosis  03/15 CT head: NAICP 03/15 TTE: LVEF 40-45%  INDWELLING DEVICES:: ETT 03/15 >>  L femoral HD cath 03/15 >>  R [place] CVL 05/15 >>   MICRO DATA: MRSA PCR 03/15 >> NEG Resp 03/15 >>  Blood 03/15 >>   ANTIMICROBIALS:  Pip-tazo 03/15 >>   HISTORY OF PRESENT ILLNESS:   As above. Pt unable to provide further history. 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  PAST SURGICAL HISTORY: He  has past surgical history that includes Carotid endarterectomy; Tonsillectomy; and Esophagogastroduodenoscopy (N/A, 03/24/2015).  No Known Allergies  No current facility-administered medications on file prior to encounter.   Current Outpatient Prescriptions on File Prior to Encounter  Medication Sig  . aspirin EC 81 MG tablet  Take 81 mg by mouth daily.  . enalapril (VASOTEC) 5 MG tablet Take 5 mg by mouth daily.  . metFORMIN (GLUCOPHAGE) 1000 MG tablet Take 1,000 mg by mouth 2 (two) times daily with a meal.  . nadolol (CORGARD) 40 MG tablet Take 40 mg by mouth daily.  Marland Kitchen omeprazole (PRILOSEC) 20 MG capsule Take 20 mg by mouth daily.  . simvastatin (ZOCOR) 40 MG tablet Take 40 mg by mouth daily.  . traZODone (DESYREL) 100 MG tablet Take 100 mg by mouth at bedtime.  . vitamin B-12 (CYANOCOBALAMIN) 1000 MCG tablet Take 1,000 mcg by mouth daily.  Marland Kitchen ALPRAZolam (XANAX) 0.25 MG tablet Take 0.25 mg by mouth at bedtime as needed for sleep.    FAMILY HISTORY:  His has no family status information on file.   SOCIAL HISTORY: He  reports that he has quit smoking. He does not have any smokeless tobacco history on file.  REVIEW OF SYSTEMS:   Unable to obtain  SUBJECTIVE:    VITAL SIGNS: BP 91/37 mmHg  Pulse 87  Temp(Src) 93 F (33.9 C) (Oral)  Resp 13  Ht  (1.702 m)  Wt 77.61 kg (171 lb 1.6 oz)  BMI 26.79 kg/m2  SpO2 86%  HEMODYNAMICS:    VENTILATOR SETTINGS: Vent Mode:  [-] PRVC FiO2 (%):  [100 %] 100 % Set Rate:  [15 bmp] 15 bmp Vt Set:  [520 mL] 520 mL PEEP:  [5 cmH20-18 cmH20] 18 cmH20 Plateau Pressure:  [15 cmH20-26 cmH20] 26 cmH20  INTAKE / OUTPUT:    PHYSICAL EXAMINATION: General: sedated, intubated Neuro: prior to sedation PERRL, + F/C, MAEs HEENT: NCAT  Cardiovascular: distant HS, regular, no M noted Lungs: diffuse bilateral dependent crackles Abdomen: distended, BS absent, no rebound Ext: warm, absent distal pulses Skin: multiple telangectasias  LABS:  BMET  Recent Labs Lab 02/04/16 0905 02/04/16 1044 02/04/16 1247  NA 130* 130* 132*  K 6.7* 6.8* 6.5*  CL 102 102 105  CO2 11* 8* 13*  BUN 66* 67* 62*  CREATININE 5.78* 5.81* 5.32*  GLUCOSE 80 79 109*    Electrolytes  Recent Labs Lab 02/04/16 0905 02/04/16 1044 02/04/16 1247  CALCIUM 6.5* 6.7* 7.3*  MG  --   1.2* 1.2*  PHOS  --  8.0* 8.8*    CBC  Recent Labs Lab 12-Feb-2016 1858 02/04/16 0502 02/04/16 0905  WBC 9.1 9.7 12.5*  HGB 11.4* 9.8* 9.9*  HCT 34.6* 29.2* 30.8*  PLT 91* 81* 80*    Coag's  Recent Labs Lab 02/04/16 0905  APTT 48*  INR 1.70    Sepsis Markers  Recent Labs Lab 02/04/16 1044 02/04/16 1250  LATICACIDVEN 6.6* 7.8*    ABG  Recent Labs Lab 02/04/16 1400  PHART 6.97*  PCO2ART 49*  PO2ART 69*    Liver Enzymes  Recent Labs Lab 02/04/16 0502 02/04/16 0905 02/04/16 1044 02/04/16 1247  AST 33 49*  --   --   ALT 17 21  --   --   ALKPHOS 105 119  --   --   BILITOT 1.7* 1.8*  --   --   ALBUMIN 2.5* 2.3* 2.2* 1.9*    Cardiac Enzymes  Recent Labs Lab 02/04/16 0905  TROPONINI 0.10*    Glucose  Recent Labs Lab 02/04/16 0848  GLUCAP 72    CXR: edema vs ARDS pattern   DISCUSSION: Principal Problem:   Pancreatitis, acute Active Problems:   Acute renal failure (HCC)   Hyperkalemia   Lactic acidosis   Acute respiratory failure with hypoxia (HCC)   ARDS (adult respiratory distress syndrome) (HCC)   Refractory shock (HCC)  Very critically ill. Very poor prognosis.   ASSESSMENT / PLAN:  PULMONARY A: Acute resp failure with hypoxia ARDS vs pulmonary edema Refractory hypoxemia P:   Vent settings established Vent bundle implemented Daily SBT as indicated ARDS strategy  CARDIOVASCULAR A:  Refractory shock - likely septic + cardiogenic Cardiomyopathy  P:  MAP goal > 65 mmHg He is on maximum vasopressors including DA, NE, epi, VP Empiric hydrocortisone for possible relative adrenal insufficiency  RENAL A:   AKI Lactic acidosis Hyperkalemia P:   Monitor BMET intermittently Monitor I/Os Correct electrolytes as indicated CRRT per Renal service HCO3 gtt running  GASTROINTESTINAL A:   Acute pancreatitis, likely alcoholic Possible cholecystitis Cirrhotic changes on abd [place] P:   SUP: IV PPI Consider nutrition  when shock improves  HEMATOLOGIC A:   Anemia without overt bleeding Thrombocytopenia P:  DVT px: SCDs Monitor CBC intermittently Transfuse per usual ICU guidelines  INFECTIOUS A:   Severe sepsis Pancreatitis Possible cholecystitis P:   Monitor temp, WBC count Micro and abx as above  ENDOCRINE A:   DM2 Presumed relative adrenal insufficiency P:   SSI, mod scale q 4 hrs Empiric hydrocortisone  NEUROLOGIC A:   Acute encephalopathy ICU/vent associated discomfort P:   RASS goal: -2, -3 PAD protocol   FAMILY  - Updates: Family was updated on multiple occasions over course of day  CCM time: 100 mins The above time includes time spent in consultation with patient and/or family members and reviewing care plan on multidisciplinary rounds  Onalee Hua  Sung AmabileSimonds, MD PCCM service Mobile [phone] Pager [phone]       Pulmonary and Critical Care Medicine [facility] Pager: [phone]  02/04/2016, 4:25 PM

## 2016-02-04 NOTE — Progress Notes (Addendum)
Dr. Sheryle Hailiamond updated patient BP 85/51 after 1 liter NS bolus. Urgency this morning but unable to void. Bladder scan 141mL. No new orders. Will continue to monitor.

## 2016-02-04 NOTE — Progress Notes (Signed)
Elink notified of pt troponin= 0.84 by bedside RN. Elink MD made aware of critical value.

## 2016-02-04 NOTE — Progress Notes (Signed)
[facility] - [place] [facility]   PATIENT NAME: Jay Mejia    MR#:  [id number]  DATE OF BIRTH:  12/26/1950  SUBJECTIVE:  Patient admitted yesterday with AKI, hyperkalemia, alcoholic pancreatitis Fall overnight - CT head performed no intracranial abnormalities Hypotensive overnight -- I noticed abnormal labs - hyperK, ordered insulin/dextrose - went to assess patient and found unresponsive Code blue -- epi x2, bicarb x2, intubated with assistance of ED, transfer to ICU - PCCM updated at bedside  REVIEW OF SYSTEMS:  Unable to obtain given medical condition/mental status  DRUG ALLERGIES:  No Known Allergies  VITALS:  Blood pressure 70/51, pulse 60, temperature 98 F (36.7 C), temperature source Oral, resp. rate 20, height  (1.702 m), weight 77.61 kg (171 lb 1.6 oz), SpO2 89 %.  PHYSICAL EXAMINATION:   VITAL SIGNS: Filed Vitals:   02/04/16 0825 02/04/16 0844  BP: 151/68 95/52  Pulse:  76  Temp:    Resp:  12   currently GENERAL:64 y.o.male critically ill, intubated and mechanically ventilated  HEAD: Normocephalic, atraumatic.  EYES: Pupils equal, round,  sluggishly reactive to light. Unable to assess extraocular muscles given mental status/medical condition. No scleral icterus.  MOUTH: Moist mucosal membrane. Dentition intact. No abscess noted.  EAR, NOSE, THROAT: Clear without exudates. No external lesions.  NECK: Supple. No thyromegaly. No nodules. No JVD.  PULMONARY:  coarse breath sounds left greater than right without wheeze rails or rhonci. No use of accessory muscles,poor respiratory effort. good air entry bilaterally CHEST: Nontender to palpation.  CARDIOVASCULAR: S1 and S2. Regular rate and rhythm. No murmurs, rubs, or gallops. No edema. Pedal pulses 2+ bilaterally.  GASTROINTESTINAL: Soft, nontender, nondistended. No masses. Positive bowel sounds. No hepatosplenomegaly.  MUSCULOSKELETAL: No swelling, clubbing, or edema. Range of  motion full in all extremities.  NEUROLOGIC: Unable to assess given mental status/medical condition SKIN: No ulceration, lesions, rashes, or cyanosis. Skin warm and dry. Turgor intact.  PSYCHIATRIC: Unable to assess given mental status/medical condition        LABORATORY PANEL:   CBC  Recent Labs Lab 02/04/16 0502  WBC 9.7  HGB 9.8*  HCT 29.2*  PLT 81*   ------------------------------------------------------------------------------------------------------------------  Chemistries   Recent Labs Lab 02/04/16 0502  NA 128*  K 6.3*  CL 100*  CO2 10*  GLUCOSE 70  BUN 64*  CREATININE 5.74*  CALCIUM 6.8*  AST 33  ALT 17  ALKPHOS 105  BILITOT 1.7*   ------------------------------------------------------------------------------------------------------------------  Cardiac Enzymes No results for input(s): TROPONINI in the last 168 hours. ------------------------------------------------------------------------------------------------------------------  RADIOLOGY:  Ct Head Wo Contrast  02/04/2016  CLINICAL DATA:  Fall EXAM: CT HEAD WITHOUT CONTRAST TECHNIQUE: Contiguous axial images were obtained from the base of the skull through the vertex without intravenous contrast. COMPARISON:  Neck CTA 12/20/2012 FINDINGS: Initial encounter. Skull and Sinuses:Right posterior scalp swelling without fracture. No hemo sinus. Visualized orbits: Negative. Brain: No evidence of acute infarction, hemorrhage, hydrocephalus, or mass lesion/mass effect. Chronic coarse calcification within the right pons with no visible vascular malformation on 2014 CTA. There is no associated edema and this is considered incidental. Calcified intracranial atherosclerosis. IMPRESSION: 1. No evidence of intracranial injury. 2. Right posterior scalp contusion without fracture. Electronically Signed   By: Marnee Spring M.D.   On: 02/04/2016 02:39   US Abdomen Complete  02/26/2016  CLINICAL DATA:  Acute  pancreatitis EXAM: ABDOMEN ULTRASOUND COMPLETE COMPARISON:  March 19, 2014 FINDINGS: Gallbladder: There are sludge and gallstones identified in the gallbladder.  The gallbladder wall is thickened. No sonographic Murphy sign noted by sonographer. Common bile duct: Diameter: 3 mm Liver: No focal lesion identified. There is diffuse increased echotexture of the liver with nodular contour of the liver surface. IVC: No abnormality visualized. Pancreas: Not well visualized. Spleen: Size and appearance within normal limits. Right Kidney: Length: 12.4 cm. Echogenicity within normal limits. No mass or hydronephrosis visualized. Left Kidney: Length: 13 cm. Echogenicity within normal limits. No mass or hydronephrosis visualized. Abdominal aorta: No aneurysm visualized. Other findings: Ascites is identified in all 4 quadrants. IMPRESSION: Gallstones and sludge in the gallbladder. There is gallbladder wall thickening. The sonographer does not report a positive sonographic Murphy sign. The findings are equivocal for cholecystitis. Diffuse increased echotexture of the liver, nonspecific but can be seen in fatty infiltration of liver. There is a nodular contour for of the liver surface suggesting changes of cirrhosis. Ascites. Electronically Signed   By: Sherian ReinWei-Chen  Lin M.D.   On: 01/23/2016 16:25    EKG:  No orders found for this or any previous visit.  ASSESSMENT AND PLAN:   65 -year-old Caucasian gentleman admitted 01/22/2016 with apparent alcoholic pancreatitis, to get him by acute kidney injury, hyperkalemia. It appears that he is under resisted from volume standpoint  1. Post arrest:? Related to hyperkalemia patient was not on off unit telemetry  received bicarbonate 2 during code,   continue insulin/dextrose/calcium has previously ordered Repeat stat labs 2. Acute respiratory failure hypoxemic requiring mechanical ventilation: Continue full ventilator support wean FiO2 and PEEP as tolerated, chest x-ray ordered  for placement 3. Acute kidney injury/ATN: Nephrology following patient may require dialysis if no improvement avoid further nephrotoxic agents 4. Alcoholic pancreatitis: It seems the first 24 hours received between 1 and 2 L of fluid. Ordered 3 more liter bolus of normal saline-as respiratory status tolerates, increase rate 250 mL/hour normal saline 5. Venous thromboembolism prophylactic: Currently heparin, check INR to assess liver function, follow platelets   Attempt to update family - wife Moshe CiproBritt - as in previous note unsuccessful PCCM updated at [facility]ide  All the records are reviewed and case discussed with Care Management/Social Workerr. Management plans discussed with the patient, family and they are in agreement.  CODE STATUS:full  TOTAL TIME TAKING CARE OF THIS PATIENT: total 80 critical care  minutes.   POSSIBLE D/C IN 5 DAYS, DEPENDING ON CLINICAL CONDITION.   Hower,  Mardi MainlandDavid K M.D on 02/04/2016 at 8:41 AM  Between 7am to 6pm - Pager - [phone]  After 6pm: House Pager: - [phone]  [facility]  Office  [phone]  CC: Primary care physician; Clydie BraunFITZGERALD, DAVID, MD

## 2016-02-04 NOTE — Procedures (Signed)
PROCEDURE NOTE: R FEMORAL HD CATH PLACEMENT  INDICATION:    Emergent need for CRRT  CONSENT:   Performed emergently  PROCEDURE  Sterile technique was used including antiseptics, cap, gloves, gown, hand hygiene, mask and full body sheet.  Skin prep: Chlorhexidine; local anesthetic administered  A trialysis catheter was placed in the L femoral vein using the Seldinger technique.  Ultrasound was used for vessel identification and guidance.   EVALUATION:  Blood flow good  Complications: No apparent complications  Patient tolerated the procedure well.   Billy Fischeravid Simonds, MD PCCM service Mobile [phone]

## 2016-02-04 NOTE — Op Note (Signed)
[facility]   PROCEDURE NOTE  PROCEDURE: 1. Right femoral arterial line 2. Right femoral artery cannulation under ultrasound guidance  PRE-OPERATIVE DIAGNOSIS: MSOF, profound hypotension requiring pressors, ARF  POST-OPERATIVE DIAGNOSIS: same as above  SURGEON: Platon Arocho, MD  ANESTHESIA:  None  ESTIMATED BLOOD LOSS: minimal  FINDING(S): none  SPECIMEN(S):  none  INDICATIONS:   Jay Mejia is a 65 y.o. male who presents with need for arterial access for hemodynamic monitoring.  He is critically ill.  The patient's family is aware the risks of arterial catheter placement include but are not limited to: bleeding, infection,  possible malpositioning in the venous system, and possible infections related to long-term catheter presence. The patient was aware of these risks and agreed to proceed.  DESCRIPTION: After written informed consent was obtained from the patient and/or family, the patient was placed supine in the hospital bed.  The patient was prepped with chloraprep and draped in the standard fashion. A micropuncture needle was used to cannulate the right femoral artery under direct ultrasound guidance after the radial artery was unable to be accessed.  I micropuncture wire was placed and then the arterial catheter was placed over the wire and the wire removed with pulsatile blood flow seen. The catheter was secured in placed with interrupted stitches of 3-0 Silk tied to the catheter.  The catheter was dressed with sterile dressing.   COMPLICATIONS: none apparent  CONDITION: stable  Jay Mejia 02/04/2016, 4:42 PM

## 2016-02-04 NOTE — Progress Notes (Signed)
Family updated  Wife - britt

## 2016-02-04 NOTE — Progress Notes (Signed)
*  PRELIMINARY RESULTS* Echocardiogram 2D Echocardiogram has been performed.  Georgann HousekeeperJerry R Hege 02/04/2016, 1:31 PM

## 2016-02-04 NOTE — Consult Note (Signed)
[facility] Vascular Consult Note  MRN : [id number]  Jay Mejia is a 65 y.o. (07-24-1951) male who presents with chief complaint of No chief complaint on file. Marland Kitchen  History of Present Illness: Patient is a critically ill individual who presented with cardiac arrest early this morning. He had hyperkalemia and acute renal failure. He has profound hypotension requiring pressors and we have been asked to place an arterial line. He has multiple ongoing issues artery has a dialysis catheter for CRRT and a triple lumen placed. He is on the ventilator. He is requiring fusion doses of pressors to keep his blood pressure up. He is unable to provide any history and this is obtained from the previous medical record  Current Facility-Administered Medications  Medication Dose Route Frequency Provider Last Rate Last Dose  . albumin human 25 % solution 25 g  25 g Intravenous Q6H Merwyn Katos, MD   25 g at 02/04/16 1558  . antiseptic oral rinse solution (CORINZ)  7 mL Mouth Rinse QID Merwyn Katos, MD   7 mL at 02/04/16 1200  . bisacodyl (DULCOLAX) suppository 10 mg  10 mg Rectal Daily PRN Merwyn Katos, MD      . calcium gluconate 1 g in sodium chloride 0.9 % 100 mL IVPB  1 g Intravenous Once Wyatt Haste, MD   Stopped at 02/04/16 0900  . chlorhexidine gluconate (PERIDEX) 0.12 % solution 15 mL  15 mL Mouth Rinse BID Merwyn Katos, MD   15 mL at 02/04/16 0900  . dextrose 50 % solution 25 mL  25 mL Intravenous Once Wyatt Haste, MD   25 mL at 02/04/16 0745  . DOPamine (INTROPIN) 800 mg in dextrose 5 % 250 mL (3.2 mg/mL) infusion  0-20 mcg/kg/min Intravenous Continuous Merwyn Katos, MD 29.1 mL/hr at 02/04/16 1230 20 mcg/kg/min at 02/04/16 1230  . EPINEPHrine (ADRENALIN) 4 mg in dextrose 5 % 250 mL (0.016 mg/mL) infusion  0.5-20 mcg/min Intravenous Titrated Merwyn Katos, MD 75 mL/hr at 02/04/16 1530 20 mcg/min at 02/04/16 1530  . fentaNYL (SUBLIMAZE) 2,500 mcg in sodium  chloride 0.9 % 250 mL (10 mcg/mL) infusion  25-400 mcg/hr Intravenous Continuous Merwyn Katos, MD 0.5 mL/hr at 02/04/16 1140 5 mcg/hr at 02/04/16 1140  . fentaNYL (SUBLIMAZE) bolus via infusion 25 mcg  25 mcg Intravenous Q1H PRN Merwyn Katos, MD      . folic acid 1 mg in sodium chloride 0.9 % 50 mL IVPB  1 mg Intravenous Once Merwyn Katos, MD      . heparin injection 1,000-6,000 Units  1,000-6,000 Units CRRT PRN Mosetta Pigeon, MD      . heparin injection 5,000 Units  5,000 Units Subcutaneous 3 times per day Altamese Dilling, MD   5,000 Units at 2016-02-19 1710  . hydrocortisone sodium succinate (SOLU-CORTEF) 100 MG injection 50 mg  50 mg Intravenous Q6H Merwyn Katos, MD   50 mg at 02/04/16 1553  . insulin aspart (novoLOG) injection 0-15 Units  0-15 Units Subcutaneous 6 times per day Merwyn Katos, MD   0 Units at 02/04/16 0900  . insulin aspart (novoLOG) injection 10 Units  10 Units Intravenous Once Wyatt Haste, MD   10 Units at 02/04/16 0800  . [START ON 02/05/2016] iohexol (OMNIPAQUE) 240 MG/ML injection 25 mL  25 mL Oral Q1 Hr x 2 Merwyn Katos, MD      . midazolam (VERSED) injection 1 mg  1 mg Intravenous Q15 min PRN Merwyn Katosavid B Simonds, MD      . midazolam (VERSED) injection 1 mg  1 mg Intravenous Q2H PRN Merwyn Katosavid B Simonds, MD      . norepinephrine (LEVOPHED) 16 mg in dextrose 5 % 250 mL (0.064 mg/mL) infusion  0-40 mcg/min Intravenous Titrated Merwyn Katosavid B Simonds, MD 46.9 mL/hr at 02/04/16 1404 50 mcg/min at 02/04/16 1404  . ondansetron (ZOFRAN) injection 4 mg  4 mg Intravenous Q6H PRN Altamese DillingVaibhavkumar Vachhani, MD      . pantoprazole (PROTONIX) injection 40 mg  40 mg Intravenous Daily Merwyn Katosavid B Simonds, MD   40 mg at 02/04/16 1412  . piperacillin-tazobactam (ZOSYN) IVPB 3.375 g  3.375 g Intravenous 3 times per day Wyatt Hasteavid K Hower, MD      . pureflow IV solution for Dialysis   CRRT Continuous Harmeet Singh, MD      . sodium bicarbonate 150 mEq in dextrose 5 % 1,000 mL infusion   Intravenous  Continuous Merwyn Katosavid B Simonds, MD 125 mL/hr at 02/04/16 1126    . thiamine (B-1) injection 100 mg  100 mg Intravenous Daily Merwyn Katosavid B Simonds, MD   100 mg at 02/04/16 1411  . vasopressin (PITRESSIN) 40 Units in sodium chloride 0.9 % 250 mL (0.16 Units/mL) infusion  0.03 Units/min Intravenous Continuous Merwyn Katosavid B Simonds, MD 11.3 mL/hr at 02/04/16 1230 0.03 Units/min at 02/04/16 1230    Past Medical History  Diagnosis Date  . Hypertension   . Diabetes mellitus without complication (HCC)   . 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 Carotid endarterectomy    . Tonsillectomy    . Esophagogastroduodenoscopy N/A 03/24/2015    Procedure: ESOPHAGOGASTRODUODENOSCOPY (EGD);  Surgeon: Wallace CullensPaul Y Oh, MD;  [facility] END[facility]COPY;  Service: Gastroenterology;  Laterality: N/A;    Social History Social History  Substance Use Topics  . Smoking status: Former Games developermoker  . Smokeless tobacco: None  . Alcohol Use: None  unable to obtain other than listed above  Family History Family History  Problem Relation Age of Onset  . Hypertension Father   otherwise unable to obtain  No Known Allergies   REVIEW OF SYSTEMS (Negative unless checked)  Unable to obtain due to the severity of the illness   Physical Examination  Filed Vitals:   02/04/16 1515 02/04/16 1530 02/04/16 1545 02/04/16 1600  BP:  79/38  91/37  Pulse: 84 86 86 87  Temp: 92.7 F (33.7 C) 92.8 F (33.8 C) 92.8 F (33.8 C) 93 F (33.9 C)  TempSrc:      Resp: 14 14 14 13   Height:      Weight:      SpO2: 90% 88% 86% 86%   Body mass index is 26.79 kg/(m^2). Gen:  WD/WN, critically ill appearing Head: [place]/AT, No temporalis wasting.  Ear/Nose/Throat: Hearing grossly intact, nares w/o erythema or drainage, oropharynx w/o Erythema/Exudate Eyes: PERRLA, EOMI.  Neck: Supple, no nuchal rigidity.  No JVD.  Pulmonary:  Good air movement on the vent, course BS Cardiac:  tachycardic Vascular:  Vessel Right Left  Radial Not Palpable Not Palpable  Ulnar Not Palpable Not Palpable  Brachial Palpable Palpable  Carotid Palpable, without bruit Palpable, without bruit  Aorta Not palpable N/A  Femoral Not Palpable Not Palpable  Popliteal Not Palpable Not Palpable  PT Not Palpable Not Palpable  DP Not Palpable Not Palpable   Gastrointestinal: distended, unable to assess tenderness Musculoskeletal:  Extremities mottled.  No deformity or atrophy. Moderate LE edema Neurologic: intubated and sedated, unable to assess Psychiatric: intubated and sedated, unable to assess Dermatologic: No rashes or ulcers noted.  No cellulitis or open wounds. Lymph : No Cervical, Axillary, or Inguinal lymphadenopathy.    CBC Lab Results  Component Value Date   WBC 12.5* 02/04/2016   HGB 9.9* 02/04/2016   HCT 30.8* 02/04/2016   MCV 103.8* 02/04/2016   PLT 80* 02/04/2016    BMET    Component Value Date/Time   NA 132* 02/04/2016 1247   NA 143 01/27/2013 0359   K 6.5* 02/04/2016 1247   K 3.7 01/27/2013 0359   CL 105 02/04/2016 1247   CL 109* 01/27/2013 0359   CO2 13* 02/04/2016 1247   CO2 26 01/27/2013 0359   GLUCOSE 109* 02/04/2016 1247   GLUCOSE 113* 01/27/2013 0359   BUN 62* 02/04/2016 1247   BUN 12 01/27/2013 0359   CREATININE 5.32* 02/04/2016 1247   CREATININE 0.91 01/27/2013 0359   CALCIUM 7.3* 02/04/2016 1247   CALCIUM 7.8* 01/27/2013 0359   GFRNONAA 10* 02/04/2016 1247   GFRNONAA >60 01/27/2013 0359   GFRAA 12* 02/04/2016 1247   GFRAA >60 01/27/2013 0359   Estimated Creatinine Clearance: 12.8 mL/min (by C-G formula based on Cr of 5.32).  COAG Lab Results  Component Value Date   INR 1.70 02/04/2016   INR 1.2 01/27/2013    Radiology Dg Chest 1 View  02/04/2016  CLINICAL DATA:  Post intubation and OG tube placement. EXAM: CHEST 1 VIEW COMPARISON:  None. FINDINGS: Endotracheal tube has tip 4.3 cm above the carina. Enteric tube has tip and proximal  port over the stomach in the left upper quadrant. Right subclavian central venous catheter has tip overlying the SVC. External defibrillator pads over the lower left chest wall. Lungs are adequately inflated with moderate bilateral perihilar opacification likely moderate interstitial edema. No definite effusion. Borderline cardiomegaly. Mild degenerative change of the spine. IMPRESSION: Moderate bilateral perihilar opacification likely moderate interstitial edema. Borderline cardiomegaly. Tubes and lines as described. Electronically Signed   By: Elberta Fortis M.D.   On: 02/04/2016 12:46   Dg Abd 1 View  02/04/2016  CLINICAL DATA:  Orogastric tube placement EXAM: ABDOMEN - 1 VIEW COMPARISON:  03/01/2014 FINDINGS: Orogastric tube tip is in the fundus of the stomach. No disproportionate dilatation bowel in the abdomen. No obvious free intraperitoneal gas. Patchy opacities of both lung bases are noted. IMPRESSION: Orogastric tube tip is in the fundus of the stomach. Electronically Signed   By: Jolaine Click M.D.   On: 02/04/2016 12:45   Ct Head Wo Contrast  02/04/2016  CLINICAL DATA:  Fall EXAM: CT HEAD WITHOUT CONTRAST TECHNIQUE: Contiguous axial images were obtained from the base of the skull through the vertex without intravenous contrast. COMPARISON:  Neck CTA 12/20/2012 FINDINGS: Initial encounter. Skull and Sinuses:Right posterior scalp swelling without fracture. No hemo sinus. Visualized orbits: Negative. Brain: No evidence of acute infarction, hemorrhage, hydrocephalus, or mass lesion/mass effect. Chronic coarse calcification within the right pons with no visible vascular malformation on 2014 CTA. There is no associated edema and this is considered incidental. Calcified intracranial atherosclerosis. IMPRESSION: 1. No evidence of intracranial injury. 2. Right posterior scalp contusion without fracture. Electronically Signed   By: Marnee Spring M.D.   On: 02/04/2016 02:39   US Abdomen  Complete  February 26, 2016  CLINICAL DATA:  Acute pancreatitis EXAM: ABDOMEN ULTRASOUND COMPLETE COMPARISON:  March 19, 2014 FINDINGS: Gallbladder: There are  sludge and gallstones identified in the gallbladder. The gallbladder wall is thickened. No sonographic Murphy sign noted by sonographer. Common bile duct: Diameter: 3 mm Liver: No focal lesion identified. There is diffuse increased echotexture of the liver with nodular contour of the liver surface. IVC: No abnormality visualized. Pancreas: Not well visualized. Spleen: Size and appearance within normal limits. Right Kidney: Length: 12.4 cm. Echogenicity within normal limits. No mass or hydronephrosis visualized. Left Kidney: Length: 13 cm. Echogenicity within normal limits. No mass or hydronephrosis visualized. Abdominal aorta: No aneurysm visualized. Other findings: Ascites is identified in all 4 quadrants. IMPRESSION: Gallstones and sludge in the gallbladder. There is gallbladder wall thickening. The sonographer does not report a positive sonographic Murphy sign. The findings are equivocal for cholecystitis. Diffuse increased echotexture of the liver, nonspecific but can be seen in fatty infiltration of liver. There is a nodular contour for of the liver surface suggesting changes of cirrhosis. Ascites. Electronically Signed   By: Sherian Rein M.D.   On: 02/10/2016 16:25      Assessment/Plan 1. MSOF and severe hypotension requiring pressors.  Arterial line placed for BP monitoring.  See op note for details.   2. ARF. On CRRT.   3. Respiratory failure. On the vent   Denajah Farias, MD  02/04/2016 4:35 PM

## 2016-02-04 NOTE — Progress Notes (Signed)
CRRT started

## 2016-02-04 NOTE — Care Management (Signed)
Admitted  to 1C with acute pancreatitis/acute renal failure.  Code blue called this mornining for unresponsiveness- no pulse.  CPR- intubated and now on full ventilator support in the icu

## 2016-02-04 NOTE — Progress Notes (Signed)
   02/04/16 0900  Clinical Encounter Type  Visited With Family  Visit Type Follow-up  Referral From Nurse  Consult/Referral To Chaplain  Spiritual Encounters  Spiritual Needs Emotional;Prayer  Stress Factors  Patient Stress Factors Health changes  Family Stress Factors Major life changes;Health changes  Met w/patient's family. Escorted from 1C to ICU Waiting Room. Notified health care team that family was waiting. Sat w/family and provided prayer. Chap. Nyree Applegate G. [place]

## 2016-02-04 NOTE — Consult Note (Signed)
Patient admitted with renal failure, pancreatitis, dehydration, a history of cirrhosis, coronary heart disease, had cardiac arrest and now on ventilator and pressors.  Abd exam shows distention with minimal bowel sounds if any.  No specific GI recommendations at this time.

## 2016-02-04 NOTE — Progress Notes (Signed)
Initial Nutrition Assessment    INTERVENTION:   Coordination of Care: recommend initiation of nutrition support as soon as clinically feasible   NUTRITION DIAGNOSIS:   Inadequate oral intake related to acute illness as evidenced by NPO status.  GOAL:   Provide needs based on ASPEN/SCCM guidelines  MONITOR:   Vent status, Labs, I & O's, Weight trends (Energy Intake)  REASON FOR ASSESSMENT:   Ventilator    ASSESSMENT:    Pt admitted with acute pancreatitis, ARF with hyperkalemia, s/p fall last night; s/p Code Blue this AM requiring intubation and transfer to ICU, hypotensive, vascath placed with plans to start CRRT; pt with known cirrhosis, ascites on US; noted CT abdomen and chest xray pending  Past Medical History  Diagnosis Date  . Hypertension   . Diabetes mellitus without complication (HCC)   . 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 [facility](HCC)     Diet Order:   NPO  Skin:  Reviewed, no issues  Last BM:  3/14    Recent Labs Lab 02/04/16 0502 02/04/16 0905 02/04/16 1044  NA 128* 130* 130*  K 6.3* 6.7* 6.8*  CL 100* 102 102  CO2 10* 11* 8*  BUN 64* 66* 67*  CREATININE 5.74* 5.78* 5.81*  CALCIUM 6.8* 6.5* 6.7*  MG  --   --  1.2*  PHOS  --   --  8.0*  GLUCOSE 70 80 79    Glucose Profile:   Recent Labs  02/04/16 0848  GLUCAP 72   Lipid Profile:     Component Value Date/Time   CHOL 85 02/04/2016 0502   TRIG 150* 02/04/2016 0502   HDL <10* 02/04/2016 0502   CHOLHDL NOT CALCULATED 02/04/2016 0502   VLDL 30 02/04/2016 0502   LDLCALC NOT CALCULATED 02/04/2016 0502   Protein Profile:   Recent Labs Lab 02/04/16 0502 02/04/16 0905 02/04/16 1044  ALBUMIN 2.5* 2.3* 2.2*   Nutritional Anemia Profile:  CBC Latest Ref Rng 02/04/2016 02/04/2016 10-20-2016  WBC 3.8 - 10.6 K/uL 12.5(H) 9.7 9.1  Hemoglobin 13.0 - 18.0 g/dL 1.6(X9.9(L) 0.9(U9.8(L) 11.4(L)  Hematocrit 40.0 - 52.0 % 30.8(L) 29.2(L) 34.6(L)  Platelets 150 - 440 K/uL 80(L) 81(L) 91(L)     Lipase     Component Value Date/Time   LIPASE 2559* 02/04/2016 0502    Meds: ss novolog, precedex, levophed, vasopressin, sodium bicarb at 125 ml/hr  Height:   Ht Readings from Last 1 Encounters:  2016/05/08 5\' 7"  (1.702 m)    Weight:   Wt Readings from Last 1 Encounters:  2016/05/08 171 lb 1.6 oz (77.61 kg)    BMI:  Body mass index is 26.79 kg/(m^2).  Estimated Nutritional Needs:   Kcal:  1580 kcals (Ve: 6.9, Tmax: 36.7) using wt of 77.6 kg  Protein:  117-156 g (1.5-2.0 g/kg)   Fluid:  1950-2340 mL (25-30 ml/kg)     HIGH Care Level  Romelle Starcherate Madisun Hargrove MS, RD, LDN [phone] Pager  [phone] Weekend/On-Call Pager

## 2016-02-04 NOTE — Progress Notes (Signed)
Access pressures continue to alarm. Continue to decrease rate.

## 2016-02-04 NOTE — Progress Notes (Signed)
Received intubated patient from 1C following code blue.  Patient ETT secured using ETAD, and placed on servo i vent without complications.  Will continue to monitor.

## 2016-02-04 NOTE — Progress Notes (Signed)
Dr. Sung AmabileSimonds has made multiple changes to ventilator throughout shift.

## 2016-02-04 NOTE — Progress Notes (Addendum)
Subjective:  Patient was hypotensive last night Received fluid bolus Code blue this AM with chest compressions Transferred to ICU Currently intubated and sedated Wide QRS on EKG and high potassium Critical care team is using shifting measures - Calcium Dr Darrol AngelSimons in getting ready to place vascath   Objective:  Vital signs in last 24 hours:  Temp:  [97.8 F (36.6 C)-98 F (36.7 C)] 98 F (36.7 C) (03/15 0520) Pulse Rate:  [58-76] 76 (03/15 0844) Resp:  [12-20] 12 (03/15 0844) BP: (70-151)/(33-68) 95/52 mmHg (03/15 0844) SpO2:  [89 %-100 %] 96 % (03/15 0844) Weight:  [77.61 kg (171 lb 1.6 oz)] 77.61 kg (171 lb 1.6 oz) (03/14 1500)  Weight change:  Filed Weights   09/13/2016 1500  Weight: 77.61 kg (171 lb 1.6 oz)    Intake/Output:   No intake or output data in the 24 hours ending 02/04/16 0925   Physical Exam: General: Critically ill  HEENT Eyes closed, ETT,   Neck No masses  Pulm/lungs Coarse crackles b/l, Vent assisted  CVS/Heart Regular, tachycardic, wide WRS  Abdomen:  Soft, non distended  Extremities: Trace edema  Neurologic: sedated  Skin: No acute rashes  Access: To be placed       Basic Metabolic Panel:   Recent Labs Lab 09/13/2016 1858 02/04/16 0502  NA  --  128*  K  --  6.3*  CL  --  100*  CO2  --  10*  GLUCOSE  --  70  BUN  --  64*  CREATININE 5.56* 5.74*  CALCIUM  --  6.8*     CBC:  Recent Labs Lab 09/13/2016 1858 02/04/16 0502  WBC 9.1 9.7  HGB 11.4* 9.8*  HCT 34.6* 29.2*  MCV 103.4* 104.5*  PLT 91* 81*      Microbiology:  No results found for this or any previous visit (from the past 720 hour(s)).  Coagulation Studies: No results for input(s): LABPROT, INR in the last 72 hours.  Urinalysis: No results for input(s): COLORURINE, LABSPEC, PHURINE, GLUCOSEU, HGBUR, BILIRUBINUR, KETONESUR, PROTEINUR, UROBILINOGEN, NITRITE, LEUKOCYTESUR in the last 72 hours.  Invalid input(s): APPERANCEUR    Imaging: Ct Head Wo  Contrast  02/04/2016  CLINICAL DATA:  Fall EXAM: CT HEAD WITHOUT CONTRAST TECHNIQUE: Contiguous axial images were obtained from the base of the skull through the vertex without intravenous contrast. COMPARISON:  Neck CTA 12/20/2012 FINDINGS: Initial encounter. Skull and Sinuses:Right posterior scalp swelling without fracture. No hemo sinus. Visualized orbits: Negative. Brain: No evidence of acute infarction, hemorrhage, hydrocephalus, or mass lesion/mass effect. Chronic coarse calcification within the right pons with no visible vascular malformation on 2014 CTA. There is no associated edema and this is considered incidental. Calcified intracranial atherosclerosis. IMPRESSION: 1. No evidence of intracranial injury. 2. Right posterior scalp contusion without fracture. Electronically Signed   By: Marnee SpringJonathon  Watts M.D.   On: 02/04/2016 02:39   [place] Abdomen Complete  01-03-2016  CLINICAL DATA:  Acute pancreatitis EXAM: ABDOMEN ULTRASOUND COMPLETE COMPARISON:  March 19, 2014 FINDINGS: Gallbladder: There are sludge and gallstones identified in the gallbladder. The gallbladder wall is thickened. No sonographic Murphy sign noted by sonographer. Common bile duct: Diameter: 3 mm Liver: No focal lesion identified. There is diffuse increased echotexture of the liver with nodular contour of the liver surface. IVC: No abnormality visualized. Pancreas: Not well visualized. Spleen: Size and appearance within normal limits. Right Kidney: Length: 12.4 cm. Echogenicity within normal limits. No mass or hydronephrosis visualized. Left Kidney: Length: 13 cm. Echogenicity  within normal limits. No mass or hydronephrosis visualized. Abdominal aorta: No aneurysm visualized. Other findings: Ascites is identified in all 4 quadrants. IMPRESSION: Gallstones and sludge in the gallbladder. There is gallbladder wall thickening. The sonographer does not report a positive sonographic Murphy sign. The findings are equivocal for cholecystitis. Diffuse  increased echotexture of the liver, nonspecific but can be seen in fatty infiltration of liver. There is a nodular contour for of the liver surface suggesting changes of cirrhosis. Ascites. Electronically Signed   By: Sherian Rein M.D.   On: 01/30/2016 16:25     Medications:   . dexmedetomidine    . fentaNYL infusion INTRAVENOUS    .  sodium bicarbonate  infusion 1000 mL     . antiseptic oral rinse  7 mL Mouth Rinse QID  . calcium chloride      . calcium gluconate  1 g Intravenous Once  . chlorhexidine gluconate  15 mL Mouth Rinse BID  . dextrose  25 mL Intravenous Once  . heparin  5,000 Units Subcutaneous 3 times per day  . insulin aspart  0-15 Units Subcutaneous 6 times per day  . insulin aspart  10 Units Intravenous Once  . LORazepam      . pantoprazole (PROTONIX) IV  40 mg Intravenous Daily   bisacodyl, fentaNYL, midazolam, midazolam, ondansetron (ZOFRAN) IV  Assessment/ Plan:  65 y.o. male with a PMHX of Coronary disease, cirrhosis, Esophageal varices, portal hypertensive gastropathy, type 2 diabetes, hypertension, left carotid surgery. peripheral vascular disease, was admitted on 02/09/2016 with abdominal pain/Acute pancreatitis.  3/15- AM - Code blue - transferred to ICU - CRRT to be started  1. Acute renal failure. Baseline Cr 0.9 Likely secondary to ATN from Acute pancreatitis secondary to alcohol use   patient is oliguric with critically high potassium Recommend urgent CRRT Dialysis cathter to be placed at bedside by Critical care team    2. High anion gap acidosis DDX include lactic acidosis as patient is on metformin continue holding metformin  3. Hyperkalemia Urgent CRRT    LOS: 1 Latrina Guttman 3/15/20179:25 AM   Late entry: Case discussed with Patient's wife, son and daughter in law R/B/A of CRRT explained They have consented to proceed

## 2016-02-04 NOTE — Procedures (Signed)
PROCEDURE NOTE: [place] CVL PLACEMENT  INDICATION:    Monitoring of central venous pressures and/or administration of medications optimally administered in central vein  CONSENT:   Risks of procedure as well as the alternatives were explained to the patient or surrogate. Consent for procedure obtained. A time out was performed.   PROCEDURE  Sterile technique was used including antiseptics, cap, gloves, gown, hand hygiene, mask and full body sheet.  Skin prep: Chlorhexidine; local anesthetic administered  A triple lumen catheter was placed in the [place] vein using the Seldinger technique.   EVALUATION:  Blood flow good  Complications: No apparent complications  Patient tolerated the procedure well.  Chest X-ray ordered to verify placement and is pending   Chidera Dearcos, MD PCCM service Mobile [phone]    

## 2016-02-05 ENCOUNTER — Inpatient Hospital Stay: Payer: Medicare Other

## 2016-02-05 LAB — COMPREHENSIVE METABOLIC PANEL
ALBUMIN: 3 g/dL — AB (ref 3.5–5.0)
ALK PHOS: 105 U/L (ref 38–126)
ALT: 79 U/L — ABNORMAL HIGH (ref 17–63)
AST: 466 U/L — AB (ref 15–41)
Anion gap: 22 — ABNORMAL HIGH (ref 5–15)
BUN: 42 mg/dL — AB (ref 6–20)
CALCIUM: 6.7 mg/dL — AB (ref 8.9–10.3)
CHLORIDE: 96 mmol/L — AB (ref 101–111)
CO2: 11 mmol/L — AB (ref 22–32)
CREATININE: 3.94 mg/dL — AB (ref 0.61–1.24)
GFR calc Af Amer: 17 mL/min — ABNORMAL LOW (ref >60)
GFR calc non Af Amer: 15 mL/min — ABNORMAL LOW (ref >60)
GLUCOSE: 107 mg/dL — AB (ref 65–99)
Potassium: 4.7 mmol/L (ref 3.5–5.1)
SODIUM: 129 mmol/L — AB (ref 135–145)
Total Bilirubin: 2.2 mg/dL — ABNORMAL HIGH (ref 0.3–1.2)
Total Protein: 5.7 g/dL — ABNORMAL LOW (ref 6.5–8.1)

## 2016-02-05 LAB — CBC
HEMATOCRIT: 26 % — AB (ref 40.0–52.0)
HEMOGLOBIN: 8.3 g/dL — AB (ref 13.0–18.0)
MCH: 34.6 pg — ABNORMAL HIGH (ref 26.0–34.0)
MCHC: 31.7 g/dL — AB (ref 32.0–36.0)
MCV: 109.3 fL — ABNORMAL HIGH (ref 80.0–100.0)
Platelets: 45 10*3/uL — ABNORMAL LOW (ref 150–440)
RBC: 2.38 MIL/uL — ABNORMAL LOW (ref 4.40–5.90)
RDW: 16.6 % — AB (ref 11.5–14.5)
WBC: 7.4 10*3/uL (ref 3.8–10.6)

## 2016-02-05 LAB — RENAL FUNCTION PANEL
ALBUMIN: 3.1 g/dL — AB (ref 3.5–5.0)
ANION GAP: 20 — AB (ref 5–15)
Albumin: 3.1 g/dL — ABNORMAL LOW (ref 3.5–5.0)
Anion gap: 24 — ABNORMAL HIGH (ref 5–15)
BUN: 47 mg/dL — ABNORMAL HIGH (ref 6–20)
BUN: 39 mg/dL — AB (ref 6–20)
CALCIUM: 6.8 mg/dL — AB (ref 8.9–10.3)
CHLORIDE: 97 mmol/L — AB (ref 101–111)
CO2: 12 mmol/L — AB (ref 22–32)
CO2: 11 mmol/L — AB (ref 22–32)
CREATININE: 4.12 mg/dL — AB (ref 0.61–1.24)
CREATININE: 3.78 mg/dL — AB (ref 0.61–1.24)
Calcium: 6.6 mg/dL — ABNORMAL LOW (ref 8.9–10.3)
Chloride: 97 mmol/L — ABNORMAL LOW (ref 101–111)
GFR calc Af Amer: 16 mL/min — ABNORMAL LOW (ref >60)
GFR calc Af Amer: 18 mL/min — ABNORMAL LOW (ref >60)
GFR calc non Af Amer: 14 mL/min — ABNORMAL LOW (ref >60)
GFR calc non Af Amer: 15 mL/min — ABNORMAL LOW (ref >60)
GLUCOSE: 113 mg/dL — AB (ref 65–99)
GLUCOSE: 106 mg/dL — AB (ref 65–99)
PHOSPHORUS: 8.1 mg/dL — AB (ref 2.5–4.6)
POTASSIUM: 4.6 mmol/L (ref 3.5–5.1)
Phosphorus: 7.6 mg/dL — ABNORMAL HIGH (ref 2.5–4.6)
Potassium: 5 mmol/L (ref 3.5–5.1)
SODIUM: 129 mmol/L — AB (ref 135–145)
Sodium: 132 mmol/L — ABNORMAL LOW (ref 135–145)

## 2016-02-05 LAB — GLUCOSE, CAPILLARY
GLUCOSE-CAPILLARY: 93 mg/dL (ref 65–99)
GLUCOSE-CAPILLARY: 106 mg/dL — AB (ref 65–99)
GLUCOSE-CAPILLARY: 120 mg/dL — AB (ref 65–99)
GLUCOSE-CAPILLARY: 152 mg/dL — AB (ref 65–99)
Glucose-Capillary: 172 mg/dL — ABNORMAL HIGH (ref 65–99)
Glucose-Capillary: 190 mg/dL — ABNORMAL HIGH (ref 65–99)

## 2016-02-05 LAB — MAGNESIUM
MAGNESIUM: 1.3 mg/dL — AB (ref 1.7–2.4)
MAGNESIUM: 1.6 mg/dL — AB (ref 1.7–2.4)
MAGNESIUM: 1.5 mg/dL — AB (ref 1.7–2.4)
Magnesium: 1.4 mg/dL — ABNORMAL LOW (ref 1.7–2.4)
Magnesium: 1.4 mg/dL — ABNORMAL LOW (ref 1.7–2.4)
Magnesium: 1.3 mg/dL — ABNORMAL LOW (ref 1.7–2.4)

## 2016-02-05 LAB — BASIC METABOLIC PANEL
ANION GAP: 21 — AB (ref 5–15)
ANION GAP: 21 — AB (ref 5–15)
Anion gap: 23 — ABNORMAL HIGH (ref 5–15)
Anion gap: 23 — ABNORMAL HIGH (ref 5–15)
Anion gap: 23 — ABNORMAL HIGH (ref 5–15)
BUN: 46 mg/dL — ABNORMAL HIGH (ref 6–20)
BUN: 39 mg/dL — AB (ref 6–20)
BUN: 38 mg/dL — AB (ref 6–20)
BUN: 32 mg/dL — ABNORMAL HIGH (ref 6–20)
BUN: 30 mg/dL — ABNORMAL HIGH (ref 6–20)
CALCIUM: 6.2 mg/dL — AB (ref 8.9–10.3)
CALCIUM: 6.2 mg/dL — AB (ref 8.9–10.3)
CHLORIDE: 96 mmol/L — AB (ref 101–111)
CHLORIDE: 97 mmol/L — AB (ref 101–111)
CHLORIDE: 95 mmol/L — AB (ref 101–111)
CO2: 10 mmol/L — ABNORMAL LOW (ref 22–32)
CO2: 12 mmol/L — ABNORMAL LOW (ref 22–32)
CO2: 13 mmol/L — ABNORMAL LOW (ref 22–32)
CO2: 15 mmol/L — ABNORMAL LOW (ref 22–32)
CO2: 16 mmol/L — ABNORMAL LOW (ref 22–32)
CREATININE: 3.77 mg/dL — AB (ref 0.61–1.24)
CREATININE: 3.71 mg/dL — AB (ref 0.61–1.24)
Calcium: 6.8 mg/dL — ABNORMAL LOW (ref 8.9–10.3)
Calcium: 6.7 mg/dL — ABNORMAL LOW (ref 8.9–10.3)
Calcium: 6.5 mg/dL — ABNORMAL LOW (ref 8.9–10.3)
Chloride: 94 mmol/L — ABNORMAL LOW (ref 101–111)
Chloride: 93 mmol/L — ABNORMAL LOW (ref 101–111)
Creatinine, Ser: 4.1 mg/dL — ABNORMAL HIGH (ref 0.61–1.24)
Creatinine, Ser: 3.24 mg/dL — ABNORMAL HIGH (ref 0.61–1.24)
Creatinine, Ser: 3.08 mg/dL — ABNORMAL HIGH (ref 0.61–1.24)
GFR, EST AFRICAN AMERICAN: 16 mL/min — AB (ref >60)
GFR, EST AFRICAN AMERICAN: 18 mL/min — AB (ref >60)
GFR, EST AFRICAN AMERICAN: 18 mL/min — AB (ref >60)
GFR, EST AFRICAN AMERICAN: 21 mL/min — AB (ref >60)
GFR, EST AFRICAN AMERICAN: 23 mL/min — AB (ref >60)
GFR, EST NON AFRICAN AMERICAN: 14 mL/min — AB (ref >60)
GFR, EST NON AFRICAN AMERICAN: 15 mL/min — AB (ref >60)
GFR, EST NON AFRICAN AMERICAN: 16 mL/min — AB (ref >60)
GFR, EST NON AFRICAN AMERICAN: 18 mL/min — AB (ref >60)
GFR, EST NON AFRICAN AMERICAN: 20 mL/min — AB (ref >60)
GLUCOSE: 150 mg/dL — AB (ref 65–99)
GLUCOSE: 166 mg/dL — AB (ref 65–99)
Glucose, Bld: 110 mg/dL — ABNORMAL HIGH (ref 65–99)
Glucose, Bld: 105 mg/dL — ABNORMAL HIGH (ref 65–99)
Glucose, Bld: 115 mg/dL — ABNORMAL HIGH (ref 65–99)
POTASSIUM: 5.1 mmol/L (ref 3.5–5.1)
POTASSIUM: 4.6 mmol/L (ref 3.5–5.1)
POTASSIUM: 4.6 mmol/L (ref 3.5–5.1)
POTASSIUM: 4.2 mmol/L (ref 3.5–5.1)
POTASSIUM: 3.9 mmol/L (ref 3.5–5.1)
SODIUM: 129 mmol/L — AB (ref 135–145)
SODIUM: 132 mmol/L — AB (ref 135–145)
SODIUM: 131 mmol/L — AB (ref 135–145)
SODIUM: 130 mmol/L — AB (ref 135–145)
SODIUM: 130 mmol/L — AB (ref 135–145)

## 2016-02-05 LAB — LIPASE, BLOOD: Lipase: 1070 U/L — ABNORMAL HIGH (ref 11–51)

## 2016-02-05 LAB — PROTIME-INR
INR: 5.61 — AB
Prothrombin Time: 49 seconds — ABNORMAL HIGH (ref 11.4–15.0)

## 2016-02-05 LAB — FIBRIN DERIVATIVES D-DIMER (ARMC ONLY): Fibrin derivatives D-dimer (ARMC): >10000 — ABNORMAL HIGH (ref 0–499)

## 2016-02-05 LAB — PLATELET COUNT: Platelets: 31 10*3/uL — ABNORMAL LOW (ref 150–440)

## 2016-02-05 LAB — PROCALCITONIN: PROCALCITONIN: 0.96 ng/mL

## 2016-02-05 LAB — PHOSPHORUS: PHOSPHORUS: 7.8 mg/dL — AB (ref 2.5–4.6)

## 2016-02-05 LAB — APTT: APTT: 88 s — AB (ref 24–36)

## 2016-02-05 LAB — FIBRINOGEN

## 2016-02-05 MED ORDER — ALTEPLASE 2 MG IJ SOLR
2.0000 mg | Freq: Once | INTRAMUSCULAR | Status: AC
  Administered 2016-02-08: 2 mg
  Filled 2016-02-05: qty 2

## 2016-02-05 MED ORDER — EPINEPHRINE HCL 0.1 MG/ML IJ SOSY
PREFILLED_SYRINGE | INTRAMUSCULAR | Status: AC
  Administered 2016-02-05: 17:00:00
  Filled 2016-02-05: qty 40

## 2016-02-05 MED ORDER — MAGNESIUM SULFATE 2 GM/50ML IV SOLN
2.0000 g | Freq: Once | INTRAVENOUS | Status: AC
  Administered 2016-02-05: 2 g via INTRAVENOUS
  Filled 2016-02-05: qty 50

## 2016-02-05 MED ORDER — ALTEPLASE 2 MG IJ SOLR
2.0000 mg | Freq: Once | INTRAMUSCULAR | Status: AC
  Administered 2016-02-10: 2 mg
  Filled 2016-02-05: qty 2

## 2016-02-05 MED ORDER — PUREFLOW DIALYSIS SOLUTION
INTRAVENOUS | Status: DC
  Administered 2016-02-05: 20:00:00 via INTRAVENOUS_CENTRAL
  Administered 2016-02-05: 15000 mL via INTRAVENOUS_CENTRAL
  Administered 2016-02-06: 04:00:00 via INTRAVENOUS_CENTRAL

## 2016-02-05 NOTE — Progress Notes (Signed)
Subjective:  Patient remains hypotensive requiring multiple pressors CRRT started yesterday. BFR 200/ left fem vascath Remains vent assisted ETT has blood this am. Patient is anuric.   Objective:  Vital signs in last 24 hours:  Temp:  [92.7 F (33.7 C)-97.3 F (36.3 C)] 96.8 F (36 C) (03/16 0645) Pulse Rate:  [36-91] 76 (03/16 0645) Resp:  [11-26] 24 (03/16 0645) BP: (51-95)/(20-63) 74/46 mmHg (03/15 2330) SpO2:  [73 %-100 %] 90 % (03/16 0645) Arterial Line BP: (86-152)/(50-105) 97/55 mmHg (03/16 0645) FiO2 (%):  [100 %] 100 % (03/16 0600) Weight:  [84 kg (185 lb 3 oz)] 84 kg (185 lb 3 oz) (03/16 0455)  Weight change: 6.389 kg (14 lb 1.4 oz) Filed Weights   08/13/2016 1500 02/05/16 0455  Weight: 77.61 kg (171 lb 1.6 oz) 84 kg (185 lb 3 oz)    Intake/Output:    Intake/Output Summary (Last 24 hours) at 02/05/16 0829 Last data filed at 02/05/16 0631  Gross per 24 hour  Intake 5744.37 ml  Output      0 ml  Net 5744.37 ml     Physical Exam: General: Critically ill  HEENT Eyes closed, ETT,   Neck No masses  Pulm/lungs Coarse crackles b/l, Vent assisted  CVS/Heart Regular, tachycardic,   Abdomen:  Soft, non distended  Extremities: Trace edema  Neurologic: sedated  Skin: No acute rashes  Access: To be placed       Basic Metabolic Panel:   Recent Labs Lab 02/04/16 1247 02/04/16 1747 02/04/16 2018 02/04/16 2315 02/05/16 0237 02/05/16 0238 02/05/16 0644  NA 132* 131* 129* 129*  129* 129*  --  132*  K 6.5* 5.8* 5.4* 5.1  5.0 4.7  --  4.6  CL 105 101 98* 96*  97* 96*  --  97*  CO2 13* 12* 11* 10*  12* 11*  --  12*  GLUCOSE 109* 128* 119* 110*  113* 107*  --  105*  BUN 62* 54* 50* 46*  47* 42*  --  39*  CREATININE 5.32* 4.69* 4.44* 4.10*  4.12* 3.94*  --  3.77*  CALCIUM 7.3* 7.0* 6.9* 6.8*  6.8* 6.7*  --  6.7*  MG 1.2*  --  1.4* 1.4* 1.4*  --  1.3*  PHOS 8.8* 8.6* 8.3* 8.1*  --  7.8*  --      CBC:  Recent Labs Lab 08/13/2016 1858  02/04/16 0502 02/04/16 0905 02/04/16 2018 02/05/16 0237  WBC 9.1 9.7 12.5* 9.5 7.4  HGB 11.4* 9.8* 9.9* 8.7* 8.3*  HCT 34.6* 29.2* 30.8* 27.8* 26.0*  MCV 103.4* 104.5* 103.8* 109.6* 109.3*  PLT 91* 81* 80* 54* 45*      Microbiology:  Recent Results (from the past 720 hour(s))  MRSA PCR Screening     Status: None   Collection Time: 02/04/16  8:39 AM  Result Value Ref Range Status   MRSA by PCR NEGATIVE NEGATIVE Final    Comment:        The GeneXpert MRSA Assay (FDA approved for NASAL specimens only), is one component of a comprehensive MRSA colonization surveillance program. It is not intended to diagnose MRSA infection nor to guide or monitor treatment for MRSA infections.     Coagulation Studies:  Recent Labs  02/04/16 0905  LABPROT 20.0*  INR 1.70    Urinalysis: No results for input(s): COLORURINE, LABSPEC, PHURINE, GLUCOSEU, HGBUR, BILIRUBINUR, KETONESUR, PROTEINUR, UROBILINOGEN, NITRITE, LEUKOCYTESUR in the last 72 hours.  Invalid input(s): APPERANCEUR    Imaging: Dg  Chest 1 View  02/04/2016  CLINICAL DATA:  Post intubation and OG tube placement. EXAM: CHEST 1 VIEW COMPARISON:  None. FINDINGS: Endotracheal tube has tip 4.3 cm above the carina. Enteric tube has tip and proximal port over the stomach in the left upper quadrant. Right subclavian central venous catheter has tip overlying the SVC. External defibrillator pads over the lower left chest wall. Lungs are adequately inflated with moderate bilateral perihilar opacification likely moderate interstitial edema. No definite effusion. Borderline cardiomegaly. Mild degenerative change of the spine. IMPRESSION: Moderate bilateral perihilar opacification likely moderate interstitial edema. Borderline cardiomegaly. Tubes and lines as described. Electronically Signed   By: Elberta Fortis M.D.   On: 02/04/2016 12:46   Dg Abd 1 View  02/04/2016  CLINICAL DATA:  Orogastric tube placement EXAM: ABDOMEN - 1 VIEW  COMPARISON:  03/01/2014 FINDINGS: Orogastric tube tip is in the fundus of the stomach. No disproportionate dilatation bowel in the abdomen. No obvious free intraperitoneal gas. Patchy opacities of both lung bases are noted. IMPRESSION: Orogastric tube tip is in the fundus of the stomach. Electronically Signed   By: Jolaine Click M.D.   On: 02/04/2016 12:45   Ct Head Wo Contrast  02/04/2016  CLINICAL DATA:  Fall EXAM: CT HEAD WITHOUT CONTRAST TECHNIQUE: Contiguous axial images were obtained from the base of the skull through the vertex without intravenous contrast. COMPARISON:  Neck CTA 12/20/2012 FINDINGS: Initial encounter. Skull and Sinuses:Right posterior scalp swelling without fracture. No hemo sinus. Visualized orbits: Negative. Brain: No evidence of acute infarction, hemorrhage, hydrocephalus, or mass lesion/mass effect. Chronic coarse calcification within the right pons with no visible vascular malformation on 2014 CTA. There is no associated edema and this is considered incidental. Calcified intracranial atherosclerosis. IMPRESSION: 1. No evidence of intracranial injury. 2. Right posterior scalp contusion without fracture. Electronically Signed   By: Marnee Spring M.D.   On: 02/04/2016 02:39   US Abdomen Complete  15-Feb-2016  CLINICAL DATA:  Acute pancreatitis EXAM: ABDOMEN ULTRASOUND COMPLETE COMPARISON:  March 19, 2014 FINDINGS: Gallbladder: There are sludge and gallstones identified in the gallbladder. The gallbladder wall is thickened. No sonographic Murphy sign noted by sonographer. Common bile duct: Diameter: 3 mm Liver: No focal lesion identified. There is diffuse increased echotexture of the liver with nodular contour of the liver surface. IVC: No abnormality visualized. Pancreas: Not well visualized. Spleen: Size and appearance within normal limits. Right Kidney: Length: 12.4 cm. Echogenicity within normal limits. No mass or hydronephrosis visualized. Left Kidney: Length: 13 cm. Echogenicity  within normal limits. No mass or hydronephrosis visualized. Abdominal aorta: No aneurysm visualized. Other findings: Ascites is identified in all 4 quadrants. IMPRESSION: Gallstones and sludge in the gallbladder. There is gallbladder wall thickening. The sonographer does not report a positive sonographic Murphy sign. The findings are equivocal for cholecystitis. Diffuse increased echotexture of the liver, nonspecific but can be seen in fatty infiltration of liver. There is a nodular contour for of the liver surface suggesting changes of cirrhosis. Ascites. Electronically Signed   By: Sherian Rein M.D.   On: 02/15/2016 16:25   Dg Chest Port 1 View  02/05/2016  CLINICAL DATA:  Respiratory failure. EXAM: PORTABLE CHEST 1 VIEW COMPARISON:  February 04, 2016. FINDINGS: Stable cardiomediastinal silhouette. Endotracheal nasogastric tubes are unchanged in position. Right subclavian catheter is unchanged. No pneumothorax is noted. Stable mixed airspace and interstitial densities are noted throughout both lungs concerning for edema or possibly pneumonia. Minimal bilateral pleural effusions may be present. Bony thorax unremarkable.  IMPRESSION: Stable support apparatus. Stable bilateral diffuse lung opacities are noted concerning for edema or pneumonia. Electronically Signed   By: Lupita Raider, M.D.   On: 02/05/2016 07:59     Medications:   . DOPamine 8 mcg/kg/min (02/05/16 0631)  . epinephrine 20 mcg/min (02/05/16 0727)  . fentaNYL infusion INTRAVENOUS 5 mcg/hr (02/05/16 0631)  . norepinephrine (LEVOPHED) Adult infusion 50 mcg/min (02/05/16 0631)  . pureflow 2,000 mL/hr at 02/05/16 0700  .  sodium bicarbonate  infusion 1000 mL 125 mL/hr at 02/05/16 0631  . vasopressin (PITRESSIN) infusion - *FOR SHOCK* 0.03 Units/min (02/05/16 0631)   . antiseptic oral rinse  7 mL Mouth Rinse QID  . aspirin  325 mg Per Tube Daily  . calcium gluconate  1 g Intravenous Once  . chlorhexidine gluconate  15 mL Mouth Rinse BID   . dextrose  25 mL Intravenous Once  . hydrocortisone sod succinate (SOLU-CORTEF) inj  50 mg Intravenous Q6H  . insulin aspart  0-15 Units Subcutaneous 6 times per day  . insulin aspart  10 Units Intravenous Once  . iohexol  25 mL Oral Q1 Hr x 2  . pantoprazole (PROTONIX) IV  40 mg Intravenous Daily  . piperacillin-tazobactam (ZOSYN)  IV  3.375 g Intravenous 3 times per day  . thiamine IV  100 mg Intravenous Daily   bisacodyl, fentaNYL, heparin, midazolam, midazolam, ondansetron (ZOFRAN) IV  Assessment/ Plan:  65 y.o. male with a PMHX of Coronary disease, cirrhosis, Esophageal varices, portal hypertensive gastropathy, type 2 diabetes, hypertension, left carotid surgery. peripheral vascular disease, was admitted on 02/20/2016 with abdominal pain/Acute pancreatitis.  3/15- AM - Code blue - transferred to ICU - CRRT started 3/16- Multiple pressors- CRRT contd. anuric  1. Acute renal failure. (Baseline Cr 0.9) with multiple organ failure Likely secondary to severe ATN from Acute pancreatitis secondary to alcohol use   patient is anuric Continue CRRT for volume and electrolyte correction 2- K formula  2. High anion gap acidosis Lactic acidosis  3. Hyperkalemia Level corrected continue CRRT Replace magnesium    LOS: 2 Narciso Stoutenburg 3/16/20178:29 AM

## 2016-02-05 NOTE — Progress Notes (Signed)
[facility] [place] at [facility]   PATIENT NAME: Jay Mejia    MR#:  [id number]  DATE OF BIRTH:  [birth year]  SUBJECTIVE:   Remains critically ill with multiorgan failure. Patient is on 4 vasopressors, 100% FiO2 with 20 of PEEP, noted to be anemic thrombocytopenic. Also on CRRT. Remains acidotic. Patient's son is at bedside.  REVIEW OF SYSTEMS:  Unable to obtain given medical condition/mental status  DRUG ALLERGIES:  No Known Allergies  VITALS:  Blood pressure 74/46, pulse 81, temperature 97 F (36.1 C), temperature source Rectal, resp. rate 28, height  (1.702 m), weight 84 kg (185 lb 3 oz), SpO2 96 %.  PHYSICAL EXAMINATION:   VITAL SIGNS: Filed Vitals:   02/05/16 1500 02/05/16 1515  BP:    Pulse: 80 81  Temp: 97 F (36.1 C) 97 F (36.1 C)  Resp: 25 28    GENERAL:65 y.o.male critically ill, intubated and mechanically ventilated  HEAD: Normocephalic, atraumatic.  EYES: Pupils equal, round,  sluggishly reactive to light. Unable to assess extraocular muscles given mental status/medical condition. No scleral icterus.  MOUTH: Moist mucosal membrane. Dentition intact. No abscess noted.  EAR, NOSE, THROAT: Clear without exudates. No external lesions.  NECK: Supple. No thyromegaly. No nodules. No JVD.  PULMONARY:  coarse breath sounds left greater than right without wheeze rails or rhonci. No use of accessory muscles,poor respiratory effort. good air entry bilaterally CHEST: Nontender to palpation.  CARDIOVASCULAR: S1 and S2. Regular rate and rhythm. No murmurs, rubs, or gallops. No edema. Pedal pulses 2+ bilaterally.  GASTROINTESTINAL: Soft, nontender, nondistended. No masses. Positive bowel sounds. No hepatosplenomegaly.  MUSCULOSKELETAL: No swelling, clubbing, or edema.  NEUROLOGIC: Unable to assess given mental status/medical condition SKIN: No ulceration, lesions, rashes, or cyanosis. Skin warm and dry. Turgor intact.  PSYCHIATRIC: Unable  to assess given mental status/medical condition   LABORATORY PANEL:   CBC  Recent Labs Lab 02/05/16 0237 02/05/16 1049  WBC 7.4  --   HGB 8.3*  --   HCT 26.0*  --   PLT 45* 31*   ------------------------------------------------------------------------------------------------------------------  Chemistries   Recent Labs Lab 02/05/16 0237  02/05/16 1049  NA 129*  < > 131*  K 4.7  < > 4.6  CL 96*  < > 95*  CO2 11*  < > 13*  GLUCOSE 107*  < > 115*  BUN 42*  < > 38*  CREATININE 3.94*  < > 3.71*  CALCIUM 6.7*  < > 6.5*  MG 1.4*  < > 1.3*  AST 466*  --   --   ALT 79*  --   --   ALKPHOS 105  --   --   BILITOT 2.2*  --   --   < > = values in this interval not displayed. ------------------------------------------------------------------------------------------------------------------  Cardiac Enzymes  Recent Labs Lab 02/04/16 2018  TROPONINI 0.84*   ------------------------------------------------------------------------------------------------------------------  RADIOLOGY:  Dg Chest 1 View  02/04/2016  CLINICAL DATA:  Post intubation and OG tube placement. EXAM: CHEST 1 VIEW COMPARISON:  None. FINDINGS: Endotracheal tube has tip 4.3 cm above the carina. Enteric tube has tip and proximal port over the stomach in the left upper quadrant. Right subclavian central venous catheter has tip overlying the SVC. External defibrillator pads over the lower left chest wall. Lungs are adequately inflated with moderate bilateral perihilar opacification likely moderate interstitial edema. No definite effusion. Borderline cardiomegaly. Mild degenerative change of the spine. IMPRESSION: Moderate bilateral perihilar opacification likely moderate interstitial edema.  Borderline cardiomegaly. Tubes and lines as described. Electronically Signed   By: Elberta Fortisaniel  Boyle M.D.   On: 02/04/2016 12:46   Dg Abd 1 View  02/04/2016  CLINICAL DATA:  Orogastric tube placement EXAM: ABDOMEN - 1 VIEW  COMPARISON:  03/01/2014 FINDINGS: Orogastric tube tip is in the fundus of the stomach. No disproportionate dilatation bowel in the abdomen. No obvious free intraperitoneal gas. Patchy opacities of both lung bases are noted. IMPRESSION: Orogastric tube tip is in the fundus of the stomach. Electronically Signed   By: Jolaine ClickArthur  Hoss M.D.   On: 02/04/2016 12:45   Ct Head Wo Contrast  02/04/2016  CLINICAL DATA:  Fall EXAM: CT HEAD WITHOUT CONTRAST TECHNIQUE: Contiguous axial images were obtained from the base of the skull through the vertex without intravenous contrast. COMPARISON:  Neck CTA 12/20/2012 FINDINGS: Initial encounter. Skull and Sinuses:Right posterior scalp swelling without fracture. No hemo sinus. Visualized orbits: Negative. Brain: No evidence of acute infarction, hemorrhage, hydrocephalus, or mass lesion/mass effect. Chronic coarse calcification within the right pons with no visible vascular malformation on 2014 CTA. There is no associated edema and this is considered incidental. Calcified intracranial atherosclerosis. IMPRESSION: 1. No evidence of intracranial injury. 2. Right posterior scalp contusion without fracture. Electronically Signed   By: Marnee SpringJonathon  Watts M.D.   On: 02/04/2016 02:39   [place] Abdomen Complete  01/22/2016  CLINICAL DATA:  Acute pancreatitis EXAM: ABDOMEN ULTRASOUND COMPLETE COMPARISON:  March 19, 2014 FINDINGS: Gallbladder: There are sludge and gallstones identified in the gallbladder. The gallbladder wall is thickened. No sonographic Murphy sign noted by sonographer. Common bile duct: Diameter: 3 mm Liver: No focal lesion identified. There is diffuse increased echotexture of the liver with nodular contour of the liver surface. IVC: No abnormality visualized. Pancreas: Not well visualized. Spleen: Size and appearance within normal limits. Right Kidney: Length: 12.4 cm. Echogenicity within normal limits. No mass or hydronephrosis visualized. Left Kidney: Length: 13 cm. Echogenicity  within normal limits. No mass or hydronephrosis visualized. Abdominal aorta: No aneurysm visualized. Other findings: Ascites is identified in all 4 quadrants. IMPRESSION: Gallstones and sludge in the gallbladder. There is gallbladder wall thickening. The sonographer does not report a positive sonographic Murphy sign. The findings are equivocal for cholecystitis. Diffuse increased echotexture of the liver, nonspecific but can be seen in fatty infiltration of liver. There is a nodular contour for of the liver surface suggesting changes of cirrhosis. Ascites. Electronically Signed   By: Sherian ReinWei-Chen  Lin M.D.   On: 02/13/2016 16:25   Dg Chest Port 1 View  02/05/2016  CLINICAL DATA:  Respiratory failure. EXAM: PORTABLE CHEST 1 VIEW COMPARISON:  February 04, 2016. FINDINGS: Stable cardiomediastinal silhouette. Endotracheal nasogastric tubes are unchanged in position. Right subclavian catheter is unchanged. No pneumothorax is noted. Stable mixed airspace and interstitial densities are noted throughout both lungs concerning for edema or possibly pneumonia. Minimal bilateral pleural effusions may be present. Bony thorax unremarkable. IMPRESSION: Stable support apparatus. Stable bilateral diffuse lung opacities are noted concerning for edema or pneumonia. Electronically Signed   By: Lupita RaiderJames  Green Jr, M.D.   On: 02/05/2016 07:59    ASSESSMENT AND PLAN:   65 -year-old Caucasian gentleman admitted 01/25/2016 with apparent alcoholic pancreatitis, to get him by acute kidney injury, hyperkalemia. It appears that he is under resisted from volume standpoint  1. Post cardiac arrest: likely  Related to hyperkalemia patient was not on off unit telemetry  - potassium has improved.  No arrhythmia.  Echo showing normal EF of 50-55%.  2. Acute respiratory failure hypoxemic requiring mechanical ventilation: This is probably due to underlying ARDS post cardiac arrest and underlying acute pancreatitis. -Continue full vent support,  patient currently on 100% FiO2 with 20 of PEEP. -Pulmonary following and defer vent management to them.   3. Acute kidney injury/ATN: With metabolic acidosis. Status post initiation of CRRT. -Creatinine improving, remains oliguric/anuric. -Remains hemodynamically unstable on 4 vasopressors and will continue CRRT and further care as per nephrology.  4. Alcoholic pancreatitis: Lipase 1610 yesterday.  - cont. Supportive care w/ IV fluids, vasopressors.   5. Anemia/thrombocytopenia-due to underlying DIC/sepsis -Continue supportive care and follow counts. Transfuse as needed. Hold anticoagulants.  6. Shock-hypovolemic/septic. -Continue IV fluids, vasopressors, broad-spectrum IV antibiotics with Zosyn.  Pt. Remains critically ill with multi-organ failure and high risk for Cardioresp. Death. Explained to Son at bedside and he is aware.   All the records are reviewed and case discussed with Care Management/Social Workerr. Management plans discussed with the patient, family and they are in agreement.  CODE STATUS:full  TOTAL TIME TAKING CARE OF THIS PATIENT: 35 min.    POSSIBLE D/C IN 1-2 DAYS, DEPENDING ON CLINICAL CONDITION.   Houston Siren M.D on 02/05/2016 at 3:25 PM  Between 7am to 6pm - Pager - [phone]  After 6pm: House Pager: - [phone]  [facility]  Office  [phone]  CC: Primary care physician; Clydie Braun, MD

## 2016-02-05 NOTE — Progress Notes (Signed)
PHARMACY - CRITICAL CARE PROGRESS NOTE  Pharmacy Consult for CRRT Medication Adjustment   No Known Allergies  Patient Measurements: Height: 5\' 7"  (170.2 cm) Weight: 185 lb 3 oz (84 kg) IBW/kg (Calculated) : 66.1  Vital Signs: Temp: 95.9 F (35.5 C) (03/16 1045) Temp Source: Rectal (03/16 0830) Pulse Rate: 36 (03/16 1045) Intake/Output from previous day: 03/15 0701 - 03/16 0700 In: 5744.4 [I.V.:5294.4; IV Piggyback:450] Out: 0  Intake/Output from this shift:   Vent settings for last 24 hours: Vent Mode:  [-] PRVC FiO2 (%):  [100 %] 100 % Set Rate:  [15 bmp-35 bmp] 35 bmp Vt Set:  [400 mL-520 mL] 400 mL PEEP:  [10 cmH20-20 cmH20] 20 cmH20 Plateau Pressure:  [15 cmH20-29 cmH20] 29 cmH20  Labs:  Recent Labs  02/04/16 0502 02/04/16 0905  02/04/16 2018 02/04/16 2315 02/05/16 0237 02/05/16 0238 02/05/16 0644 02/05/16 1049  WBC 9.7 12.5*  --  9.5  --  7.4  --   --   --   HGB 9.8* 9.9*  --  8.7*  --  8.3*  --   --   --   HCT 29.2* 30.8*  --  27.8*  --  26.0*  --   --   --   PLT 81* 80*  --  54*  --  45*  --   --  31*  APTT  --  48*  --   --   --   --   --   --   --   INR  --  1.70  --   --   --   --   --   --   --   CREATININE 5.74* 5.78*  < > 4.44* 4.10*  4.12* 3.94*  --  3.78*  3.77*  --   MG  --   --   < > 1.4* 1.4* 1.4*  --  1.3*  --   PHOS  --   --   < > 8.3* 8.1*  --  7.8* 7.6*  --   ALBUMIN 2.5* 2.3*  < > 2.9* 3.1* 3.0*  --  3.1*  --   PROT 6.2* 6.0*  --   --   --  5.7*  --   --   --   AST 33 49*  --   --   --  466*  --   --   --   ALT 17 21  --   --   --  79*  --   --   --   ALKPHOS 105 119  --   --   --  105  --   --   --   BILITOT 1.7* 1.8*  --   --   --  2.2*  --   --   --   BILIDIR 1.0*  --   --   --   --   --   --   --   --   IBILI 0.7  --   --   --   --   --   --   --   --   < > = values in this interval not displayed. Estimated Creatinine Clearance: 20 mL/min (by C-G formula based on Cr of 3.77).   Recent Labs  02/04/16 2348 02/05/16 0403  02/05/16 0733  GLUCAP 107* 93 106*    Microbiology: Recent Results (from the past 720 hour(s))  MRSA PCR Screening     Status: None  Collection Time: 02/04/16  8:39 AM  Result Value Ref Range Status   MRSA by PCR NEGATIVE NEGATIVE Final    Comment:        The GeneXpert MRSA Assay (FDA approved for NASAL specimens only), is one component of a comprehensive MRSA colonization surveillance program. It is not intended to diagnose MRSA infection nor to guide or monitor treatment for MRSA infections.   Culture, blood (Routine X 2) w Reflex to ID Panel     Status: None (Preliminary result)   Collection Time: 02/04/16 12:34 PM  Result Value Ref Range Status   Specimen Description BLOOD RIGHT ARM  Final   Special Requests   Final    BOTTLES DRAWN AEROBIC AND ANAEROBIC AER ANA   Culture NO GROWTH < 24 HOURS  Final   Report Status PENDING  Incomplete  Culture, blood (Routine X 2) w Reflex to ID Panel     Status: None (Preliminary result)   Collection Time: 02/04/16 12:49 PM  Result Value Ref Range Status   Specimen Description BLOOD RIGHT HAND  Final   Special Requests   Final    BOTTLES DRAWN AEROBIC AND ANAEROBIC AER ANA   Culture NO GROWTH < 24 HOURS  Final   Report Status PENDING  Incomplete  Culture, respiratory (NON-Expectorated)     Status: None (Preliminary result)   Collection Time: 02/04/16  2:50 PM  Result Value Ref Range Status   Specimen Description TRACHEAL ASPIRATE  Final   Special Requests NONE  Final   Gram Stain PENDING  Incomplete   Culture HOLDING FOR POSSIBLE PATHOGEN  Final   Report Status PENDING  Incomplete    Medications:  Scheduled:  . alteplase  2 mg Intracatheter Once  . alteplase  2 mg Intracatheter Once  . antiseptic oral rinse  7 mL Mouth Rinse QID  . calcium gluconate  1 g Intravenous Once  . chlorhexidine gluconate  15 mL Mouth Rinse BID  . dextrose  25 mL Intravenous Once  . hydrocortisone sod succinate (SOLU-CORTEF) inj   50 mg Intravenous Q6H  . insulin aspart  0-15 Units Subcutaneous 6 times per day  . insulin aspart  10 Units Intravenous Once  . magnesium sulfate 1 - 4 g bolus IVPB  2 g Intravenous Once  . pantoprazole (PROTONIX) IV  40 mg Intravenous Daily  . piperacillin-tazobactam (ZOSYN)  IV  3.375 g Intravenous 3 times per day  . thiamine IV  100 mg Intravenous Daily   Infusions:  . DOPamine 8 mcg/kg/min (02/05/16 0631)  . epinephrine 20 mcg/min (02/05/16 0727)  . fentaNYL infusion INTRAVENOUS 5 mcg/hr (02/05/16 0631)  . norepinephrine (LEVOPHED) Adult infusion 50 mcg/min (02/05/16 1050)  . pureflow 2,000 mL/hr at 02/05/16 0700  .  sodium bicarbonate  infusion 1000 mL 200 mL/hr at 02/05/16 1049  . vasopressin (PITRESSIN) infusion - *FOR SHOCK* 0.03 Units/min (02/05/16 1045)    Assessment: Pharmacy consulted to assist in adjusting medications for CRRT in this 65 y/o M admitted with ARF and severe pancreatitis.   Plan:  No medications require adjustment for CRRT at present.   Pharmacy will continue to monitor and adjust per consult.   Luisa Hart D 02/05/2016,11:34 AM

## 2016-02-05 NOTE — Consult Note (Signed)
Pt with multiple system failure after few day illness with pancreatitis,renal failure, cardiac arrest now with severe acidosis with HCO3 11 range, DIC with falling platelets and elevated PT/INR, now on pressors, HCO3 drip, antibiotics, prognosis grim.  No specific recommendations for his pancreatitis.

## 2016-02-05 NOTE — Progress Notes (Signed)
Dr. Bard HerbertSimmonds at bedside. Aware of Fibrinogen level

## 2016-02-05 NOTE — Progress Notes (Signed)
Pharmacy Antibiotic Note  Jay Mejia is a 65 y.o. male with acute renal failure requiring CRRT admitted on 26-Feb-2016 with severe pancreatitis.  Pharmacy has been consulted for Zosyn dosing.  Plan: Zosyn 3.375g IV q8h (4 hour infusion).  Height: 5\' 7"  (170.2 cm) Weight: 185 lb 3 oz (84 kg) IBW/kg (Calculated) : 66.1  Temp (24hrs), Avg:95.4 F (35.2 C), Min:92.7 F (33.7 C), Max:97.3 F (36.3 C)   Recent Labs Lab 10-27-16 1858 02/04/16 0502 02/04/16 0905 02/04/16 1044  02/04/16 1250 02/04/16 1747 02/04/16 2018 02/04/16 2315 02/05/16 0237 02/05/16 0644  WBC 9.1 9.7 12.5*  --   --   --   --  9.5  --  7.4  --   CREATININE 5.56* 5.74* 5.78* 5.81*  < >  --  4.69* 4.44* 4.10*  4.12* 3.94* 3.78*  3.77*  LATICACIDVEN  --   --   --  6.6*  --  7.8*  --   --   --   --   --   < > = values in this interval not displayed.  Estimated Creatinine Clearance: 20 mL/min (by C-G formula based on Cr of 3.77).    No Known Allergies  Antimicrobials this admission: Zosyn 3/15 >>    Dose adjustments this admission:   Microbiology results: BCx: NGTD TA: NGTD 3/15 MRSA PCR: negative  Thank you for allowing pharmacy to be a part of this patient's care.  Luisa HartChristy, Kamyah Wilhelmsen D 02/05/2016 11:29 AM

## 2016-02-05 NOTE — Progress Notes (Signed)
Dr. Bard HerbertSimmonds aware of PT, INR and Fibrin lab values.

## 2016-02-05 NOTE — Progress Notes (Signed)
PULMONARY / CRITICAL CARE MEDICINE   Name: Jay Mejia MRN: [id number] DOB: [birth year]    ADMISSION DATE:  02/20/2016 CONSULTATION DATE:  03/15  REFERRING MD: Estanislado Spire  PT PROFILE:   41 M admitted 03/14 with pancreatitis, AKI, metabolic acidosis, hyperkalemia. Suffered asystolic cardiac arrest and underwent 4-5 mins of CPR including chest compressions and HCO3 with restoration of spontaneous circulation. He was intubated in course of ACLS. On arrival to ICU, he was able to interact with me and F/C but shortly thereafter developed refractory shock and hypoxemia.   MAJOR EVENTS/TEST RESULTS: 03/14 Abd [place]: Gallstones and sludge in the gallbladder. There is gallbladder wall thickening. The sonographer does not report a positive sonographic Murphy sign. The findings are equivocal for cholecystitis. Diffuse increased echotexture of the liver, nonspecific but can be seen in fatty infiltration of liver. There is a nodular contour for of the liver surface suggesting changes of cirrhosis  03/15 CT head: NAICP 03/15 TTE: LVEF 50-55% 03/15 refractory shock despite DA, NE, VP and epinephrine infusions 03/15 CRRT initiated 03/15 refractory hypoxemia despite PEEP 18-20 cm H2O and FiO2 100% 03/16 Severe hypoxemia, refractory shock, severe acidosis, anuric renal failure persist  INDWELLING DEVICES:: ETT 03/15 >>  L femoral HD cath 03/15 >>  R [place] CVL 05/15 >>  R femoral A-line 03/15 >>   MICRO DATA: MRSA PCR 03/15 >> NEG Resp 03/15 >>  Blood 03/15 >>   ANTIMICROBIALS:  Pip-tazo 03/15 >>    SUBJECTIVE:  RASS -4, Not F/C  VITAL SIGNS: BP 74/46 mmHg  Pulse 81  Temp(Src) 97 F (36.1 C) (Rectal)  Resp 28  Ht  (1.702 m)  Wt 84 kg (185 lb 3 oz)  BMI 29.00 kg/m2  SpO2 96%  HEMODYNAMICS:    VENTILATOR SETTINGS: Vent Mode:  [-] PRVC FiO2 (%):  [100 %] 100 % Set Rate:  [24 bmp-35 bmp] 35 bmp Vt Set:  [400 mL-520 mL] 400 mL PEEP:  [18 cmH20-20 cmH20] 20 cmH20 Plateau  Pressure:  [29 cmH20] 29 cmH20  INTAKE / OUTPUT: I/O last 3 completed shifts: In: 5744.4 [I.V.:5294.4; IV Piggyback:450] Out: 0   PHYSICAL EXAMINATION: General: sedated, intubated Neuro: PERRL, MAEs HEENT: NCAT Cardiovascular: distant HS, regular, no M noted Lungs: diffuse bilateral dependent crackles Abdomen: distended, BS absent, no rebound Ext: warm, absent distal pulses Skin: multiple telangectasias  LABS:  BMET  Recent Labs Lab 02/05/16 0237 02/05/16 0644 02/05/16 1049  NA 129* 132*  132* 131*  K 4.7 4.6  4.6 4.6  CL 96* 97*  97* 95*  CO2 11* 11*  12* 13*  BUN 42* 39*  39* 38*  CREATININE 3.94* 3.78*  3.77* 3.71*  GLUCOSE 107* 106*  105* 115*    Electrolytes  Recent Labs Lab 02/04/16 2315 02/05/16 0237 02/05/16 0238 02/05/16 0644 02/05/16 1049  CALCIUM 6.8*  6.8* 6.7*  --  6.6*  6.7* 6.5*  MG 1.4* 1.4*  --  1.3* 1.3*  PHOS 8.1*  --  7.8* 7.6*  --     CBC  Recent Labs Lab 02/04/16 0905 02/04/16 2018 02/05/16 0237 02/05/16 1049  WBC 12.5* 9.5 7.4  --   HGB 9.9* 8.7* 8.3*  --   HCT 30.8* 27.8* 26.0*  --   PLT 80* 54* 45* 31*    Coag's  Recent Labs Lab 02/04/16 0905 02/05/16 1049  APTT 48* 88*  INR 1.70 5.61*    Sepsis Markers  Recent Labs Lab 02/04/16 1044 02/04/16 1250 02/05/16 1049  LATICACIDVEN 6.6* 7.8*  --   PROCALCITON  --   --  0.96    ABG  Recent Labs Lab 02/04/16 1400 02/05/16 1000  PHART 6.97* 6.96*  PCO2ART 49* 49*  PO2ART 69* 60*    Liver Enzymes  Recent Labs Lab 02/04/16 0502 02/04/16 0905  02/04/16 2315 02/05/16 0237 02/05/16 0644  AST 33 49*  --   --  466*  --   ALT 17 21  --   --  79*  --   ALKPHOS 105 119  --   --  105  --   BILITOT 1.7* 1.8*  --   --  2.2*  --   ALBUMIN 2.5* 2.3*  < > 3.1* 3.0* 3.1*  < > = values in this interval not displayed.  Cardiac Enzymes  Recent Labs Lab 02/04/16 0905 02/04/16 1447 02/04/16 2018  TROPONINI 0.10* 0.37* 0.84*    Glucose  Recent  Labs Lab 02/04/16 1823 02/04/16 1954 02/04/16 2348 02/05/16 0403 02/05/16 0733 02/05/16 1155  GLUCAP 112* 120* 107* 93 106* 120*    CXR: NSC edema /ARDS pattern   DISCUSSION: Principal Problem:   Pancreatitis, acute Active Problems:   Acute renal failure (HCC)   Hyperkalemia   Lactic acidosis   Acute respiratory failure with hypoxia (HCC)   ARDS (adult respiratory distress syndrome) (HCC)   Refractory shock (HCC)  Very critically ill. Very poor prognosis.   ASSESSMENT / PLAN:  PULMONARY A: Acute resp failure with hypoxia ARDS Refractory hypoxemia P:   Cont full vent support - settings reviewed and/or adjusted Cont vent bundle Daily SBT if/when meets criteria Cont ARDS strategy  CARDIOVASCULAR A:  Refractory shock - likely septic + cardiogenic Cardiomyopathy  P:  MAP goal > 65 mmHg He is on maximum vasopressors including DA, NE, epi, VP Empiric hydrocortisone for possible relative adrenal insufficiency  RENAL A:   AKI Lactic acidosis Hyperkalemia P:   Monitor BMET intermittently Monitor I/Os Correct electrolytes as indicated CRRT per Renal service HCO3 gtt running  GASTROINTESTINAL A:   Acute pancreatitis, likely alcoholic Possible cholecystitis Cirrhotic changes on abd US P:   SUP: IV PPI Consider nutrition when shock improves  HEMATOLOGIC A:   Anemia without overt bleeding Thrombocytopenia DIC P:  DVT px: SCDs Monitor CBC intermittently Transfuse per usual guidelines - try to avoid platelets and FFP in setting of DIC  INFECTIOUS A:   Severe sepsis/SIRS Pancreatitis Possible cholecystitis P:   Monitor temp, WBC count Micro and abx as above  ENDOCRINE A:   DM2 Presumed relative adrenal insufficiency P:   SSI, mod scale q 4 hrs Cont empiric hydrocortisone  NEUROLOGIC A:   Acute encephalopathy ICU/vent associated discomfort P:   RASS goal: -2, -3 PAD protocol   FAMILY  - Updates: Family was updated on multiple  occasions over course of day  CCM time: 50 mins The above time includes time spent in consultation with patient and/or family members and reviewing care plan on multidisciplinary rounds  Billy Fischeravid Jamai Dolce, MD PCCM service Mobile [phone] Pager [phone]   02/05/2016, 3:37 PM

## 2016-02-05 NOTE — Progress Notes (Signed)
CRRT off from 0850 to 0945.  Access pressures continue to be high. Increase Bicarb to 200 ml/hr and are removing 50 ml/hr to start from patient and will increase as tolerated with MAP above 55 per Dr. Thedore MinsSingh.

## 2016-02-06 ENCOUNTER — Inpatient Hospital Stay: Payer: Medicare Other

## 2016-02-06 DIAGNOSIS — K859 Acute pancreatitis without necrosis or infection, unspecified: Secondary | ICD-10-CM

## 2016-02-06 DIAGNOSIS — J9601 Acute respiratory failure with hypoxia: Secondary | ICD-10-CM

## 2016-02-06 LAB — GLUCOSE, CAPILLARY
GLUCOSE-CAPILLARY: 174 mg/dL — AB (ref 65–99)
GLUCOSE-CAPILLARY: 164 mg/dL — AB (ref 65–99)
GLUCOSE-CAPILLARY: 108 mg/dL — AB (ref 65–99)
Glucose-Capillary: 143 mg/dL — ABNORMAL HIGH (ref 65–99)
Glucose-Capillary: 137 mg/dL — ABNORMAL HIGH (ref 65–99)

## 2016-02-06 LAB — BASIC METABOLIC PANEL
ANION GAP: 20 — AB (ref 5–15)
ANION GAP: 17 — AB (ref 5–15)
ANION GAP: 13 (ref 5–15)
Anion gap: 18 — ABNORMAL HIGH (ref 5–15)
Anion gap: 13 (ref 5–15)
Anion gap: 11 (ref 5–15)
BUN: 27 mg/dL — ABNORMAL HIGH (ref 6–20)
BUN: 24 mg/dL — ABNORMAL HIGH (ref 6–20)
BUN: 16 mg/dL (ref 6–20)
BUN: 17 mg/dL (ref 6–20)
BUN: 23 mg/dL — ABNORMAL HIGH (ref 6–20)
BUN: 20 mg/dL (ref 6–20)
CALCIUM: 6.1 mg/dL — AB (ref 8.9–10.3)
CALCIUM: 6.3 mg/dL — AB (ref 8.9–10.3)
CHLORIDE: 87 mmol/L — AB (ref 101–111)
CHLORIDE: 96 mmol/L — AB (ref 101–111)
CHLORIDE: 91 mmol/L — AB (ref 101–111)
CHLORIDE: 95 mmol/L — AB (ref 101–111)
CO2: 19 mmol/L — ABNORMAL LOW (ref 22–32)
CO2: 17 mmol/L — AB (ref 22–32)
CO2: 22 mmol/L (ref 22–32)
CO2: 23 mmol/L (ref 22–32)
CO2: 19 mmol/L — AB (ref 22–32)
CO2: 21 mmol/L — AB (ref 22–32)
CREATININE: 2.76 mg/dL — AB (ref 0.61–1.24)
CREATININE: 2.7 mg/dL — AB (ref 0.61–1.24)
CREATININE: 1.52 mg/dL — AB (ref 0.61–1.24)
CREATININE: 1.52 mg/dL — AB (ref 0.61–1.24)
CREATININE: 2.26 mg/dL — AB (ref 0.61–1.24)
CREATININE: 2.07 mg/dL — AB (ref 0.61–1.24)
Calcium: 5.9 mg/dL — CL (ref 8.9–10.3)
Calcium: 6.6 mg/dL — ABNORMAL LOW (ref 8.9–10.3)
Calcium: 6.3 mg/dL — CL (ref 8.9–10.3)
Calcium: 6.7 mg/dL — ABNORMAL LOW (ref 8.9–10.3)
Chloride: 92 mmol/L — ABNORMAL LOW (ref 101–111)
Chloride: 92 mmol/L — ABNORMAL LOW (ref 101–111)
GFR calc Af Amer: 53 mL/min — ABNORMAL LOW (ref >60)
GFR calc non Af Amer: 23 mL/min — ABNORMAL LOW (ref >60)
GFR calc non Af Amer: 46 mL/min — ABNORMAL LOW (ref >60)
GFR calc non Af Amer: 46 mL/min — ABNORMAL LOW (ref >60)
GFR calc non Af Amer: 29 mL/min — ABNORMAL LOW (ref >60)
GFR calc non Af Amer: 32 mL/min — ABNORMAL LOW (ref >60)
GFR, EST AFRICAN AMERICAN: 26 mL/min — AB (ref >60)
GFR, EST AFRICAN AMERICAN: 27 mL/min — AB (ref >60)
GFR, EST AFRICAN AMERICAN: 53 mL/min — AB (ref >60)
GFR, EST AFRICAN AMERICAN: 33 mL/min — AB (ref >60)
GFR, EST AFRICAN AMERICAN: 37 mL/min — AB (ref >60)
GFR, EST NON AFRICAN AMERICAN: 22 mL/min — AB (ref >60)
GLUCOSE: 129 mg/dL — AB (ref 65–99)
Glucose, Bld: 178 mg/dL — ABNORMAL HIGH (ref 65–99)
Glucose, Bld: 185 mg/dL — ABNORMAL HIGH (ref 65–99)
Glucose, Bld: 160 mg/dL — ABNORMAL HIGH (ref 65–99)
Glucose, Bld: 125 mg/dL — ABNORMAL HIGH (ref 65–99)
Glucose, Bld: 180 mg/dL — ABNORMAL HIGH (ref 65–99)
POTASSIUM: 3.8 mmol/L (ref 3.5–5.1)
POTASSIUM: 4.3 mmol/L (ref 3.5–5.1)
POTASSIUM: 4.3 mmol/L (ref 3.5–5.1)
Potassium: 3.8 mmol/L (ref 3.5–5.1)
Potassium: 3.6 mmol/L (ref 3.5–5.1)
Potassium: 2.9 mmol/L — CL (ref 3.5–5.1)
SODIUM: 129 mmol/L — AB (ref 135–145)
SODIUM: 124 mmol/L — AB (ref 135–145)
SODIUM: 127 mmol/L — AB (ref 135–145)
SODIUM: 130 mmol/L — AB (ref 135–145)
SODIUM: 127 mmol/L — AB (ref 135–145)
SODIUM: 129 mmol/L — AB (ref 135–145)

## 2016-02-06 LAB — MAGNESIUM
MAGNESIUM: 1.6 mg/dL — AB (ref 1.7–2.4)
MAGNESIUM: 1.9 mg/dL (ref 1.7–2.4)
Magnesium: 1.7 mg/dL (ref 1.7–2.4)
Magnesium: 1.7 mg/dL (ref 1.7–2.4)
Magnesium: 1.6 mg/dL — ABNORMAL LOW (ref 1.7–2.4)
Magnesium: 1.8 mg/dL (ref 1.7–2.4)

## 2016-02-06 LAB — COMPREHENSIVE METABOLIC PANEL
ALBUMIN: 2.7 g/dL — AB (ref 3.5–5.0)
ALK PHOS: 130 U/L — AB (ref 38–126)
ALT: 305 U/L — ABNORMAL HIGH (ref 17–63)
ANION GAP: 20 — AB (ref 5–15)
AST: 2208 U/L — AB (ref 15–41)
BUN: 26 mg/dL — AB (ref 6–20)
CO2: 16 mmol/L — AB (ref 22–32)
Calcium: 5.9 mg/dL — CL (ref 8.9–10.3)
Chloride: 88 mmol/L — ABNORMAL LOW (ref 101–111)
Creatinine, Ser: 2.79 mg/dL — ABNORMAL HIGH (ref 0.61–1.24)
GFR calc Af Amer: 26 mL/min — ABNORMAL LOW (ref >60)
GFR calc non Af Amer: 22 mL/min — ABNORMAL LOW (ref >60)
GLUCOSE: 168 mg/dL — AB (ref 65–99)
POTASSIUM: 3.6 mmol/L (ref 3.5–5.1)
SODIUM: 124 mmol/L — AB (ref 135–145)
Total Bilirubin: 2.5 mg/dL — ABNORMAL HIGH (ref 0.3–1.2)
Total Protein: 4.9 g/dL — ABNORMAL LOW (ref 6.5–8.1)

## 2016-02-06 LAB — CBC
HEMATOCRIT: 23 % — AB (ref 40.0–52.0)
HEMOGLOBIN: 7.3 g/dL — AB (ref 13.0–18.0)
MCH: 33.7 pg (ref 26.0–34.0)
MCHC: 31.9 g/dL — AB (ref 32.0–36.0)
MCV: 105.6 fL — ABNORMAL HIGH (ref 80.0–100.0)
Platelets: 28 10*3/uL — CL (ref 150–440)
RBC: 2.18 MIL/uL — ABNORMAL LOW (ref 4.40–5.90)
RDW: 16.5 % — AB (ref 11.5–14.5)
WBC: 14.2 10*3/uL — AB (ref 3.8–10.6)

## 2016-02-06 LAB — PROTIME-INR
INR: 5.48 — AB
Prothrombin Time: 48.2 seconds — ABNORMAL HIGH (ref 11.4–15.0)

## 2016-02-06 LAB — APTT: APTT: 59 s — AB (ref 24–36)

## 2016-02-06 LAB — PROCALCITONIN: PROCALCITONIN: 1.82 ng/mL

## 2016-02-06 LAB — PREPARE RBC (CROSSMATCH)

## 2016-02-06 LAB — PHOSPHORUS: PHOSPHORUS: 4.7 mg/dL — AB (ref 2.5–4.6)

## 2016-02-06 LAB — ABO/RH: ABO/RH(D): O POS

## 2016-02-06 LAB — FIBRINOGEN

## 2016-02-06 LAB — FIBRIN DERIVATIVES D-DIMER (ARMC ONLY): Fibrin derivatives D-dimer (ARMC): >10000 — ABNORMAL HIGH (ref 0–499)

## 2016-02-06 LAB — PLATELET COUNT: Platelets: 26 10*3/uL — CL (ref 150–440)

## 2016-02-06 MED ORDER — TRACE MINERALS CR-CU-MN-SE-ZN 10-1000-500-60 MCG/ML IV SOLN
INTRAVENOUS | Status: AC
  Administered 2016-02-06: 19:00:00 via INTRAVENOUS
  Filled 2016-02-06: qty 960

## 2016-02-06 MED ORDER — INSULIN GLARGINE 100 UNIT/ML ~~LOC~~ SOLN
10.0000 [IU] | Freq: Every day | SUBCUTANEOUS | Status: DC
  Administered 2016-02-06 – 2016-02-07 (×2): 10 [IU] via SUBCUTANEOUS
  Filled 2016-02-06 (×4): qty 0.1

## 2016-02-06 MED ORDER — SODIUM CHLORIDE 0.9% FLUSH
10.0000 mL | Freq: Two times a day (BID) | INTRAVENOUS | Status: DC
  Administered 2016-02-07 – 2016-02-11 (×4): 10 mL

## 2016-02-06 MED ORDER — PUREFLOW DIALYSIS SOLUTION
INTRAVENOUS | Status: DC
  Administered 2016-02-06 – 2016-02-11 (×8): via INTRAVENOUS_CENTRAL

## 2016-02-06 MED ORDER — SODIUM CHLORIDE 0.9% FLUSH: 10.0000 mL | INTRAVENOUS | Status: DC | PRN

## 2016-02-06 MED ORDER — SODIUM CHLORIDE 0.9 % IV SOLN
2.0000 g | Freq: Once | INTRAVENOUS | Status: AC
  Administered 2016-02-06: 2 g via INTRAVENOUS
  Filled 2016-02-06: qty 20

## 2016-02-06 MED ORDER — MAGNESIUM SULFATE 2 GM/50ML IV SOLN
2.0000 g | Freq: Once | INTRAVENOUS | Status: AC
  Administered 2016-02-06: 2 g via INTRAVENOUS
  Filled 2016-02-06: qty 50

## 2016-02-06 MED ORDER — POTASSIUM CHLORIDE 10 MEQ/50ML IV SOLN
10.0000 meq | INTRAVENOUS | Status: AC
  Administered 2016-02-06 (×4): 10 meq via INTRAVENOUS
  Filled 2016-02-06 (×4): qty 50

## 2016-02-06 MED ORDER — SODIUM CHLORIDE 0.9 % IV SOLN
1.0000 g | Freq: Once | INTRAVENOUS | Status: AC
  Administered 2016-02-06: 1 g via INTRAVENOUS
  Filled 2016-02-06: qty 10

## 2016-02-06 MED ORDER — SODIUM CHLORIDE 0.9 % IV SOLN
Freq: Once | INTRAVENOUS | Status: AC
  Administered 2016-02-06: 09:00:00 via INTRAVENOUS

## 2016-02-06 MED ORDER — ANTISEPTIC ORAL RINSE SOLUTION (CORINZ)
7.0000 mL | OROMUCOSAL | Status: DC
  Administered 2016-02-07 – 2016-02-11 (×51): 7 mL via OROMUCOSAL
  Filled 2016-02-06 (×65): qty 7

## 2016-02-06 NOTE — Consult Note (Signed)
Patient labs improving, air flow anterior chest on exam.  Abdomen with no active bowel sounds I can hear.  Labs better, less acidotic, potassium down, plt still low.  Discussed with son briefly.  Please order lipase on next blood draw.  GI will not see over weekend since no specific problem for us to treat or evaluate at this time.

## 2016-02-06 NOTE — Progress Notes (Signed)
Critical platelet level of 27. Hgb level of 7.3, Dr. Sung AmabileSimonds notified. No new orders at present. Will continue to monitor.

## 2016-02-06 NOTE — Progress Notes (Signed)
PARENTERAL NUTRITION CONSULT NOTE - INITIAL  Pharmacy Consult for TPN Electrolyte and Glucose Monitoring  No Known Allergies  Patient Measurements: Height: 5\' 7"  (170.2 cm) Weight: 191 lb 12.8 oz (87 kg) IBW/kg (Calculated) : 66.1  Vital Signs: Temp: 97.3 F (36.3 C) (03/17 1500) Temp Source: Rectal (03/17 1311) Pulse Rate: 73 (03/17 1500) Intake/Output from previous day: 03/16 0701 - 03/17 0700 In: 8189.1 [I.V.:7989.1; IV Piggyback:200] Out: 1880 [Urine:10] Intake/Output from this shift: Total I/O In: -  Out: 823 [Other:823]  Labs:  Recent Labs  02/04/16 0905 02/04/16 2018 02/05/16 0237 02/05/16 1049 02/06/16 0333  WBC 12.5* 9.5 7.4  --  14.2*  HGB 9.9* 8.7* 8.3*  --  7.3*  HCT 30.8* 27.8* 26.0*  --  23.0*  PLT 80* 54* 45* 31* 26*  28*  APTT 48*  --   --  88* 59*  INR 1.70  --   --  5.61* 5.48*     Recent Labs  02/04/16 0502 02/04/16 0905  02/05/16 0237 02/05/16 0238 02/05/16 0644  02/06/16 0333 02/06/16 0705 02/06/16 1216 02/06/16 1555  NA 128* 130*  < > 129*  --  132*  132*  < > 124*  124* 127* 130* 127*  K 6.3* 6.7*  < > 4.7  --  4.6  4.6  < > 3.6  3.6 2.9* 3.8 4.3  CL 100* 102  < > 96*  --  97*  97*  < > 87*  88* 92* 96* 91*  CO2 10* 11*  < > 11*  --  11*  12*  < > 17*  16* 22 23 19*  GLUCOSE 70 80  < > 107*  --  106*  105*  < > 185*  168* 160* 129* 125*  BUN 64* 66*  < > 42*  --  39*  39*  < > 24*  26* 16 17 23*  CREATININE 5.74* 5.78*  < > 3.94*  --  3.78*  3.77*  < > 2.70*  2.79* 1.52* 1.52* 2.26*  CALCIUM 6.8* 6.5*  < > 6.7*  --  6.6*  6.7*  < > 5.9*  5.9* 6.3* 6.6* 6.3*  MG  --   --   < > 1.4*  --  1.3*  < > 1.7 1.6* 1.6* 1.9  PHOS  --   --   < >  --  7.8* 7.6*  --  4.7*  --   --   --   PROT 6.2* 6.0*  --  5.7*  --   --   --  4.9*  --   --   --   ALBUMIN 2.5* 2.3*  < > 3.0*  --  3.1*  --  2.7*  --   --   --   AST 33 49*  --  466*  --   --   --  2208*  --   --   --   ALT 17 21  --  79*  --   --   --  305*  --   --   --    ALKPHOS 105 119  --  105  --   --   --  130*  --   --   --   BILITOT 1.7* 1.8*  --  2.2*  --   --   --  2.5*  --   --   --   BILIDIR 1.0*  --   --   --   --   --   --   --   --   --   --  IBILI 0.7  --   --   --   --   --   --   --   --   --   --   TRIG 150*  --   --   --   --   --   --   --   --   --   --   CHOLHDL NOT CALCULATED  --   --   --   --   --   --   --   --   --   --   CHOL 85  --   --   --   --   --   --   --   --   --   --   < > = values in this interval not displayed. Estimated Creatinine Clearance: 33.9 mL/min (by C-G formula based on Cr of 2.26).    Recent Labs  02/06/16 0757 02/06/16 1133 02/06/16 1643  GLUCAP 164* 143* 108*    Medical History: Past Medical History  Diagnosis Date  . Hypertension   . Diabetes mellitus without complication (HCC)   . 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 alteplase  2 mg Intracatheter Once  . alteplase  2 mg Intracatheter Once  . antiseptic oral rinse  7 mL Mouth Rinse QID  . calcium gluconate  1 g Intravenous Once  . calcium gluconate  1 g Intravenous Once  . chlorhexidine gluconate  15 mL Mouth Rinse BID  . dextrose  25 mL Intravenous Once  . hydrocortisone sod succinate (SOLU-CORTEF) inj  50 mg Intravenous Q6H  . insulin aspart  0-15 Units Subcutaneous 6 times per day  . insulin glargine  10 Units Subcutaneous Daily  . pantoprazole (PROTONIX) IV  40 mg Intravenous Daily  . piperacillin-tazobactam (ZOSYN)  IV  3.375 g Intravenous 3 times per day  . thiamine IV  100 mg Intravenous Daily   Infusions:  . Marland KitchenTPN (CLINIMIX-E) Adult    . epinephrine 20 mcg/min (02/06/16 0646)  . fentaNYL infusion INTRAVENOUS 50 mcg/hr (02/06/16 0600)  . norepinephrine (LEVOPHED) Adult infusion 50.987 mcg/min (02/06/16 0600)  . pureflow 6,000 mL/hr at 02/06/16 1127  . vasopressin (PITRESSIN) infusion - *FOR SHOCK* 0.03 Units/min (02/06/16 0600)    Insulin Requirements in the past 24 hours:     Current Nutrition:    Assessment: 65 y/o M admitted with severe pancreatitis and renal failure on CRRT with orders to begin TPN.   K= 4.3, Mag= 1.9, Phos= 4.7, Ca= 6.3, albumin= 2.7, CCa= 8.3  Plan:  Patient is ordered SSI and Lantus 10 units daily. K and Mg are WNL. Pt is currently receiveing 1g of calcium gulconate Will f/u next labs.   Eliazer Hemphill D Raife Lizer 02/06/2016,5:08 PM

## 2016-02-06 NOTE — Progress Notes (Signed)
Pharmacy Antibiotic Note  Jay Mejia is a 65 y.o. male with acute renal failure requiring CRRT admitted on 02/17/2016 with severe pancreatitis.  Pharmacy has been consulted for Zosyn dosing.  PCT= 1.82  Plan: Zosyn 3.375g IV q8h (4 hour infusion).  Height: 5\' 7"  (170.2 cm) Weight: 191 lb 12.8 oz (87 kg) IBW/kg (Calculated) : 66.1  Temp (24hrs), Avg:96.5 F (35.8 C), Min:95.7 F (35.4 C), Max:97.5 F (36.4 C)   Recent Labs Lab 02/04/16 0502 02/04/16 0905 02/04/16 1044  02/04/16 1250  02/04/16 2018  02/05/16 0237  02/05/16 1539 02/05/16 2012 02/05/16 2315 02/06/16 0333 02/06/16 0705  WBC 9.7 12.5*  --   --   --   --  9.5  --  7.4  --   --   --   --  14.2*  --   CREATININE 5.74* 5.78* 5.81*  < >  --   < > 4.44*  < > 3.94*  < > 3.24* 3.08* 2.76* 2.79*  2.70* 1.52*  LATICACIDVEN  --   --  6.6*  --  7.8*  --   --   --   --   --   --   --   --   --   --   < > = values in this interval not displayed.  Estimated Creatinine Clearance: 50.4 mL/min (by C-G formula based on Cr of 1.52).    No Known Allergies  Antimicrobials this admission: Zosyn 3/15 >>    Dose adjustments this admission:   Microbiology results: BCx: NGTD TA: NGTD MRSA PCR: negative   3/15 MRSA PCR: negative  Thank you for allowing pharmacy to be a part of this patient's care.  Luisa HartChristy, Kaius Daino D 02/06/2016 10:49 AM

## 2016-02-06 NOTE — Progress Notes (Signed)
PHARMACY - CRITICAL CARE PROGRESS NOTE  Pharmacy Consult for CRRT Medication Adjustment   No Known Allergies  Patient Measurements: Height:  (170.2 cm) Weight: 191 lb 12.8 oz (87 kg) IBW/kg (Calculated) : 66.1  Vital Signs: Temp: 95.9 F (35.5 C) (03/17 0800) Pulse Rate: 75 (03/17 0800) Intake/Output from previous day: 03/16 0701 - 03/17 0700 In: 8189.1 [I.V.:7989.1; IV Piggyback:200] Out: 1880 [Urine:10] Intake/Output from this shift: Total I/O In: -  Out: 194 [Other:194] Vent settings for last 24 hours: Vent Mode:  [-] PRVC FiO2 (%):  [80 %-100 %] 80 % Set Rate:  [35 bmp] 35 bmp Vt Set:  [400 mL] 400 mL PEEP:  [20 cmH20] 20 cmH20 Plateau Pressure:  [28 cmH20] 28 cmH20  Labs:  Recent Labs  02/04/16 0502 02/04/16 0905  02/04/16 2018  02/05/16 0237 02/05/16 0238 02/05/16 0644 02/05/16 1049  02/05/16 2315 02/06/16 0333 02/06/16 0705  WBC 9.7 12.5*  --  9.5  --  7.4  --   --   --   --   --  14.2*  --   HGB 9.8* 9.9*  --  8.7*  --  8.3*  --   --   --   --   --  7.3*  --   HCT 29.2* 30.8*  --  27.8*  --  26.0*  --   --   --   --   --  23.0*  --   PLT 81* 80*  --  54*  --  45*  --   --  31*  --   --  28*  --   APTT  --  48*  --   --   --   --   --   --  88*  --   --   --   --   INR  --  1.70  --   --   --   --   --   --  5.61*  --   --   --   --   CREATININE 5.74* 5.78*  < > 4.44*  < > 3.94*  --  3.78*  3.77* 3.71*  < > 2.76* 2.79*  2.70* 1.52*  MG  --   --   < > 1.4*  < > 1.4*  --  1.3* 1.3*  < > 1.7 1.7 1.6*  PHOS  --   --   < > 8.3*  < >  --  7.8* 7.6*  --   --   --  4.7*  --   ALBUMIN 2.5* 2.3*  < > 2.9*  < > 3.0*  --  3.1*  --   --   --  2.7*  --   PROT 6.2* 6.0*  --   --   --  5.7*  --   --   --   --   --  4.9*  --   AST 33 49*  --   --   --  466*  --   --   --   --   --  2208*  --   ALT 17 21  --   --   --  79*  --   --   --   --   --  305*  --   ALKPHOS 105 119  --   --   --  105  --   --   --   --   --  130*  --  BILITOT 1.7* 1.8*  --   --   --   2.2*  --   --   --   --   --  2.5*  --   BILIDIR 1.0*  --   --   --   --   --   --   --   --   --   --   --   --   IBILI 0.7  --   --   --   --   --   --   --   --   --   --   --   --   < > = values in this interval not displayed. Estimated Creatinine Clearance: 50.4 mL/min (by C-G formula based on Cr of 1.52).   Recent Labs  02/05/16 2326 02/06/16 0344 02/06/16 0757  GLUCAP 190* 174* 164*    Microbiology: Recent Results (from the past 720 hour(s))  MRSA PCR Screening     Status: None   Collection Time: 02/04/16  8:39 AM  Result Value Ref Range Status   MRSA by PCR NEGATIVE NEGATIVE Final    Comment:        The GeneXpert MRSA Assay (FDA approved for NASAL specimens only), is one component of a comprehensive MRSA colonization surveillance program. It is not intended to diagnose MRSA infection nor to guide or monitor treatment for MRSA infections.   Culture, blood (Routine X 2) w Reflex to ID Panel     Status: None (Preliminary result)   Collection Time: 02/04/16 12:34 PM  Result Value Ref Range Status   Specimen Description BLOOD RIGHT ARM  Final   Special Requests   Final    BOTTLES DRAWN AEROBIC AND ANAEROBIC AER ANA   Culture NO GROWTH < 24 HOURS  Final   Report Status PENDING  Incomplete  Culture, blood (Routine X 2) w Reflex to ID Panel     Status: None (Preliminary result)   Collection Time: 02/04/16 12:49 PM  Result Value Ref Range Status   Specimen Description BLOOD RIGHT HAND  Final   Special Requests   Final    BOTTLES DRAWN AEROBIC AND ANAEROBIC AER ANA   Culture NO GROWTH < 24 HOURS  Final   Report Status PENDING  Incomplete  Culture, respiratory (NON-Expectorated)     Status: None (Preliminary result)   Collection Time: 02/04/16  2:50 PM  Result Value Ref Range Status   Specimen Description TRACHEAL ASPIRATE  Final   Special Requests NONE  Final   Gram Stain PENDING  Incomplete   Culture HOLDING FOR POSSIBLE PATHOGEN  Final   Report  Status PENDING  Incomplete    Medications:  Scheduled:  . sodium chloride   Intravenous Once  . alteplase  2 mg Intracatheter Once  . alteplase  2 mg Intracatheter Once  . antiseptic oral rinse  7 mL Mouth Rinse QID  . calcium gluconate  1 g Intravenous Once  . chlorhexidine gluconate  15 mL Mouth Rinse BID  . dextrose  25 mL Intravenous Once  . hydrocortisone sod succinate (SOLU-CORTEF) inj  50 mg Intravenous Q6H  . insulin aspart  0-15 Units Subcutaneous 6 times per day  . insulin glargine  10 Units Subcutaneous Daily  . magnesium sulfate 1 - 4 g bolus IVPB  2 g Intravenous Once  . pantoprazole (PROTONIX) IV  40 mg Intravenous Daily  . piperacillin-tazobactam (ZOSYN)  IV  3.375 g Intravenous 3 times per  day  . potassium chloride  10 mEq Intravenous Q1 Hr x 4  . thiamine IV  100 mg Intravenous Daily   Infusions:  . epinephrine 20 mcg/min (02/06/16 0646)  . fentaNYL infusion INTRAVENOUS 50 mcg/hr (02/06/16 0600)  . norepinephrine (LEVOPHED) Adult infusion 50.987 mcg/min (02/06/16 0600)  . pureflow 2,000 mL/hr at 02/06/16 0333  . vasopressin (PITRESSIN) infusion - *FOR SHOCK* 0.03 Units/min (02/06/16 0600)    Assessment: Pharmacy consulted to assist in adjusting medications for CRRT in this 65 y/o M admitted with ARF and severe pancreatitis.   Plan:  No medications require adjustment for CRRT at present.   Pharmacy will continue to monitor and adjust per consult.   Luisa Harthristy, Azlin Zilberman D 02/06/2016,10:47 AM

## 2016-02-06 NOTE — Progress Notes (Addendum)
PARENTERAL NUTRITION CONSULT NOTE - INITIAL  Pharmacy Consult for TPN Electrolyte and Glucose Monitoring  No Known Allergies  Patient Measurements: Height: 5\' 7"  (170.2 cm) Weight: 191 lb 12.8 oz (87 kg) IBW/kg (Calculated) : 66.1  Vital Signs: Temp: 97.5 F (36.4 C) (03/17 2000) Temp Source: Rectal (03/17 1311) Pulse Rate: 74 (03/17 2000) Intake/Output from previous day: 03/16 0701 - 03/17 0700 In: 8189.1 [I.V.:7989.1; IV Piggyback:200] Out: 1880 [Urine:10] Intake/Output from this shift:    Labs:  Recent Labs  02/04/16 0905 02/04/16 2018 02/05/16 0237 02/05/16 1049 02/06/16 0333  WBC 12.5* 9.5 7.4  --  14.2*  HGB 9.9* 8.7* 8.3*  --  7.3*  HCT 30.8* 27.8* 26.0*  --  23.0*  PLT 80* 54* 45* 31* 26*  28*  APTT 48*  --   --  88* 59*  INR 1.70  --   --  5.61* 5.48*     Recent Labs  02/04/16 0502 02/04/16 0905  02/05/16 0237 02/05/16 0238 02/05/16 0644  02/06/16 0333  02/06/16 1216 02/06/16 1555 02/06/16 1942  NA 128* 130*  < > 129*  --  132*  132*  < > 124*  124*  < > 130* 127* 129*  K 6.3* 6.7*  < > 4.7  --  4.6  4.6  < > 3.6  3.6  < > 3.8 4.3 4.3  CL 100* 102  < > 96*  --  97*  97*  < > 87*  88*  < > 96* 91* 95*  CO2 10* 11*  < > 11*  --  11*  12*  < > 17*  16*  < > 23 19* 21*  GLUCOSE 70 80  < > 107*  --  106*  105*  < > 185*  168*  < > 129* 125* 180*  BUN 64* 66*  < > 42*  --  39*  39*  < > 24*  26*  < > 17 23* 20  CREATININE 5.74* 5.78*  < > 3.94*  --  3.78*  3.77*  < > 2.70*  2.79*  < > 1.52* 2.26* 2.07*  CALCIUM 6.8* 6.5*  < > 6.7*  --  6.6*  6.7*  < > 5.9*  5.9*  < > 6.6* 6.3* 6.7*  MG  --   --   < > 1.4*  --  1.3*  < > 1.7  < > 1.6* 1.9 1.8  PHOS  --   --   < >  --  7.8* 7.6*  --  4.7*  --   --   --   --   PROT 6.2* 6.0*  --  5.7*  --   --   --  4.9*  --   --   --   --   ALBUMIN 2.5* 2.3*  < > 3.0*  --  3.1*  --  2.7*  --   --   --   --   AST 33 49*  --  466*  --   --   --  2208*  --   --   --   --   ALT 17 21  --  79*  --   --    --  305*  --   --   --   --   ALKPHOS 105 119  --  105  --   --   --  130*  --   --   --   --   BILITOT  1.7* 1.8*  --  2.2*  --   --   --  2.5*  --   --   --   --   BILIDIR 1.0*  --   --   --   --   --   --   --   --   --   --   --   IBILI 0.7  --   --   --   --   --   --   --   --   --   --   --   TRIG 150*  --   --   --   --   --   --   --   --   --   --   --   CHOLHDL NOT CALCULATED  --   --   --   --   --   --   --   --   --   --   --   CHOL 85  --   --   --   --   --   --   --   --   --   --   --   < > = values in this interval not displayed. Estimated Creatinine Clearance: 37 mL/min (by C-G formula based on Cr of 2.07).    Recent Labs  02/06/16 1133 02/06/16 1643 02/06/16 2009  GLUCAP 143* 108* 137*    Medical History: Past Medical History  Diagnosis Date  . Hypertension   . Diabetes mellitus without complication (HCC)   . 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 alteplase  2 mg Intracatheter Once  . alteplase  2 mg Intracatheter Once  . antiseptic oral rinse  7 mL Mouth Rinse QID  . calcium gluconate  1 g Intravenous Once  . chlorhexidine gluconate  15 mL Mouth Rinse BID  . dextrose  25 mL Intravenous Once  . hydrocortisone sod succinate (SOLU-CORTEF) inj  50 mg Intravenous Q6H  . insulin aspart  0-15 Units Subcutaneous 6 times per day  . insulin glargine  10 Units Subcutaneous Daily  . pantoprazole (PROTONIX) IV  40 mg Intravenous Daily  . piperacillin-tazobactam (ZOSYN)  IV  3.375 g Intravenous 3 times per day  . thiamine IV  100 mg Intravenous Daily   Infusions:  . Marland KitchenTPN (CLINIMIX-E) Adult 40 mL/hr at 02/06/16 1839  . epinephrine 20 mcg/min (02/06/16 1759)  . fentaNYL infusion INTRAVENOUS 50 mcg/hr (02/06/16 0600)  . norepinephrine (LEVOPHED) Adult infusion 50 mcg/min (02/06/16 1800)  . pureflow 6,000 mL/hr at 02/06/16 1127  . vasopressin (PITRESSIN) infusion - *FOR SHOCK* 0.03 Units/min (02/06/16 0600)    Insulin  Requirements in the past 24 hours:    Current Nutrition:    Assessment: 65 y/o M admitted with severe pancreatitis and renal failure on CRRT with orders to begin TPN.   K= 4.3, Mag= 1.9, Phos= 4.7, Ca= 6.7, albumin= 2.7, CCa= 7.7   Plan:  Patient is ordered SSI and Lantus 10 units daily. K and Mg are WNL. Corrected calcium has improved some. Will give two more grams of calcium gluconate. Follow up with ionized ca. Follow up with labs at next check  Olene Floss, Pharm.D Clinical Pharmacist 02/06/2016,8:47 PM

## 2016-02-06 NOTE — Progress Notes (Signed)
PULMONARY / CRITICAL CARE MEDICINE   Name: Jay Mejia MRN: [id number] DOB: [birth year]    ADMISSION DATE:  03-03-2016 CONSULTATION DATE:  03/15  REFERRING MD: Estanislado Spire  PT PROFILE:   19 M admitted 03/14 with pancreatitis, AKI, metabolic acidosis, hyperkalemia. Suffered asystolic cardiac arrest and underwent 4-5 mins of CPR including chest compressions and HCO3 with restoration of spontaneous circulation. He was intubated in course of ACLS. On arrival to ICU, he was able to interact with me and F/C but shortly thereafter developed refractory shock and hypoxemia.   MAJOR EVENTS/TEST RESULTS: 03/14 Abd [place]: Gallstones and sludge in the gallbladder. There is gallbladder wall thickening. The sonographer does not report a positive sonographic Murphy sign. The findings are equivocal for cholecystitis. Diffuse increased echotexture of the liver, nonspecific but can be seen in fatty infiltration of liver. There is a nodular contour for of the liver surface suggesting changes of cirrhosis  03/15 CT head: NAICP 03/15 TTE: LVEF 50-55% 03/15 refractory shock despite DA, NE, VP and epinephrine infusions 03/15 CRRT initiated 03/15 refractory hypoxemia despite PEEP 18-20 cm H2O and FiO2 100% 03/16 Severe hypoxemia, refractory shock, severe acidosis, anuric renal failure persist 03/17 Improving gas exchange. Able to F/C on WUA. Remains on max vasopressors. Remains on CRRT with anuria. Acidosis improving. HCO3 gtt stopped. Severe abdominal distention - TPN initiated  INDWELLING DEVICES:: ETT 03/15 >>  L femoral HD cath 03/15 >>  R Gregory CVL 05/15 >>  R femoral A-line 03/15 >>   MICRO DATA: MRSA PCR 03/15 >> NEG Resp 03/15 >> mod GPC chains >>  Blood 03/15 >>   ANTIMICROBIALS:  Pip-tazo 03/15 >>    SUBJECTIVE:  RASS -3, + F/C intermittently  VITAL SIGNS: BP 74/46 mmHg  Pulse 72  Temp(Src) 97 F (36.1 C) (Rectal)  Resp 28  Ht  (1.702 m)  Wt 87 kg (191 lb 12.8 oz)  BMI 30.03 kg/m2   SpO2 97%  HEMODYNAMICS:    VENTILATOR SETTINGS: Vent Mode:  [-] PRVC FiO2 (%):  [75 %-100 %] 75 % Set Rate:  [35 bmp] 35 bmp Vt Set:  [400 mL] 400 mL PEEP:  [16 cmH20-20 cmH20] 16 cmH20 Plateau Pressure:  [28 cmH20] 28 cmH20  INTAKE / OUTPUT: I/O last 3 completed shifts: In: 13933.5 [I.V.:13283.5; IV Piggyback:650] Out: 1880 [Urine:10; Other:1870]  PHYSICAL EXAMINATION: General: sedated, intubated Neuro: PERRL, MAEs HEENT: NCAT Cardiovascular: distant HS, regular, no M noted Lungs: diffuse bilateral dependent crackles Abdomen: distended, BS absent, no rebound Ext: warm, absent distal pulses Skin: multiple telangectasias  LABS:  BMET  Recent Labs Lab 02/05/16 2315 02/06/16 0333 02/06/16 0705  NA 129* 124*  124* 127*  K 3.8 3.6  3.6 2.9*  CL 92* 87*  88* 92*  CO2 19* 17*  16* 22  BUN 27* 24*  26* 16  CREATININE 2.76* 2.70*  2.79* 1.52*  GLUCOSE 178* 185*  168* 160*    Electrolytes  Recent Labs Lab 02/05/16 0238 02/05/16 0644  02/05/16 2315 02/06/16 0333 02/06/16 0705  CALCIUM  --  6.6*  6.7*  < > 6.1* 5.9*  5.9* 6.3*  MG  --  1.3*  < > 1.7 1.7 1.6*  PHOS 7.8* 7.6*  --   --  4.7*  --   < > = values in this interval not displayed.  CBC  Recent Labs Lab 02/04/16 2018 02/05/16 0237 02/05/16 1049 02/06/16 0333  WBC 9.5 7.4  --  14.2*  HGB 8.7* 8.3*  --  7.3*  HCT 27.8* 26.0*  --  23.0*  PLT 54* 45* 31* 28*    Coag's  Recent Labs Lab 02/04/16 0905 02/05/16 1049  APTT 48* 88*  INR 1.70 5.61*    Sepsis Markers  Recent Labs Lab 02/04/16 1044 02/04/16 1250 02/05/16 1049 02/06/16 0333  LATICACIDVEN 6.6* 7.8*  --   --   PROCALCITON  --   --  0.96 1.82    ABG  Recent Labs Lab 02/04/16 1400 02/05/16 1000 02/06/16 0450  PHART 6.97* 6.96* 7.18*  PCO2ART 49* 49* 50*  PO2ART 69* 60* 155*    Liver Enzymes  Recent Labs Lab 02/04/16 0905  02/05/16 0237 02/05/16 0644 02/06/16 0333  AST 49*  --  466*  --  2208*   ALT 21  --  79*  --  305*  ALKPHOS 119  --  105  --  130*  BILITOT 1.8*  --  2.2*  --  2.5*  ALBUMIN 2.3*  < > 3.0* 3.1* 2.7*  < > = values in this interval not displayed.  Cardiac Enzymes  Recent Labs Lab 02/04/16 0905 02/04/16 1447 02/04/16 2018  TROPONINI 0.10* 0.37* 0.84*    Glucose  Recent Labs Lab 02/05/16 1551 02/05/16 2002 02/05/16 2326 02/06/16 0344 02/06/16 0757 02/06/16 1133  GLUCAP 152* 172* 190* 174* 164* 143*    CXR: slightly improved edema /ARDS pattern   DISCUSSION: Principal Problem:   Pancreatitis, acute Active Problems:   Acute renal failure (HCC)   Hyperkalemia   Lactic acidosis   Acute respiratory failure with hypoxia (HCC)   ARDS (adult respiratory distress syndrome) (HCC)   Refractory shock (HCC)  Remains very critically ill with poor prognosis but has demonstrated some evidence of improvement since yesterday   ASSESSMENT / PLAN:  PULMONARY A: Acute resp failure with hypoxia ARDS Severe hypoxemia - improving P:   Cont full vent support - settings reviewed and/or adjusted Cont vent bundle Daily SBT if/when meets criteria Cont ARDS strategy - PEEP decreased to 16 cm H2O  CARDIOVASCULAR A:  Refractory shock - likely septic/SIRS P:  MAP goal > 65 mmHg Cont vasopressors including NE, epi, VP - wean NE first, then epi, then VP Cont empiric hydrocortisone for possible relative adrenal insufficiency  RENAL A:   AKI Lactic acidosis, improving Hyperkalemia, resolved Hypokalemia Severe hypervolemia P:   Monitor BMET intermittently Monitor I/Os Correct electrolytes as indicated DC HCO3 gtt 03/17 CRRT per Renal service I have asked to try to achieve -50cc/hr if BP will permit  GASTROINTESTINAL A:   Acute pancreatitis, likely alcoholic Possible cholecystitis Cirrhotic changes on abd US Severe abdominal distention with ileus - too unstable presently for CT abd/pelvis P:   SUP: IV PPI TPN initiated 03/17 If becomes  more stable, would obtain CTAP Recheck lipase AM 03/18  HEMATOLOGIC A:   Acute blood loss anemia Thrombocytopenia Severe DIC P:  DVT px: SCDs Monitor CBC intermittently Transfuse per usual guidelines -   - one unit PRBCs Avoid platelets and FFP in setting of DIC unless major bleeding  INFECTIOUS A:   Severe sepsis/SIRS Pancreatitis Possible cholecystitis Mildly elevated PCT P:   Monitor temp, WBC count Micro and abx as above  ENDOCRINE A:   DM2 - marginal control Presumed relative adrenal insufficiency P:   SSI, mod scale q 4 hrs Cont empiric hydrocortisone Lantus initiated 03/17  NEUROLOGIC A:   Acute encephalopathy  ICU/vent associated discomfort P:   RASS goal: -2, -3 PAD protocol   FAMILY: wife  updated @ bedside. I conveyed that he has made some progress but remains very critically ill and prognosis remains poor. DNR is still appropriate  CCM time: 45 mins The above time includes time spent in consultation with patient and/or family members and reviewing care plan on multidisciplinary rounds  Billy Fischer, MD PCCM service Mobile [phone] Pager [phone]   02/06/2016, 12:58 PM

## 2016-02-06 NOTE — Progress Notes (Addendum)
PARENTERAL NUTRITION CONSULT NOTE - INITIAL  Pharmacy Consult for TPN Electrolyte and Glucose Monitoring  No Known Allergies  Patient Measurements: Height: 5\' 7"  (170.2 cm) Weight: 191 lb 12.8 oz (87 kg) IBW/kg (Calculated) : 66.1  Vital Signs: Temp: 95.9 F (35.5 C) (03/17 0800) Pulse Rate: 75 (03/17 0800) Intake/Output from previous day: 03/16 0701 - 03/17 0700 In: 8189.1 [I.V.:7989.1; IV Piggyback:200] Out: 1880 [Urine:10] Intake/Output from this shift: Total I/O In: -  Out: 194 [Other:194]  Labs:  Recent Labs  02/04/16 0905 02/04/16 2018 02/05/16 0237 02/05/16 1049 02/06/16 0333  WBC 12.5* 9.5 7.4  --  14.2*  HGB 9.9* 8.7* 8.3*  --  7.3*  HCT 30.8* 27.8* 26.0*  --  23.0*  PLT 80* 54* 45* 31* 28*  APTT 48*  --   --  88*  --   INR 1.70  --   --  5.61*  --      Recent Labs  02/04/16 0502 02/04/16 0905  02/05/16 0237 02/05/16 0238 02/05/16 0644  02/05/16 2315 02/06/16 0333 02/06/16 0705  NA 128* 130*  < > 129*  --  132*  132*  < > 129* 124*  124* 127*  K 6.3* 6.7*  < > 4.7  --  4.6  4.6  < > 3.8 3.6  3.6 2.9*  CL 100* 102  < > 96*  --  97*  97*  < > 92* 88*  87* 92*  CO2 10* 11*  < > 11*  --  11*  12*  < > 19* 16*  17* 22  GLUCOSE 70 80  < > 107*  --  106*  105*  < > 178* 168*  185* 160*  BUN 64* 66*  < > 42*  --  39*  39*  < > 27* 26*  24* 16  CREATININE 5.74* 5.78*  < > 3.94*  --  3.78*  3.77*  < > 2.76* 2.79*  2.70* 1.52*  CALCIUM 6.8* 6.5*  < > 6.7*  --  6.6*  6.7*  < > 6.1* 5.9*  5.9* 6.3*  MG  --   --   < > 1.4*  --  1.3*  < > 1.7 1.7 1.6*  PHOS  --   --   < >  --  7.8* 7.6*  --   --  4.7*  --   PROT 6.2* 6.0*  --  5.7*  --   --   --   --  4.9*  --   ALBUMIN 2.5* 2.3*  < > 3.0*  --  3.1*  --   --  2.7*  --   AST 33 49*  --  466*  --   --   --   --  2208*  --   ALT 17 21  --  79*  --   --   --   --  305*  --   ALKPHOS 105 119  --  105  --   --   --   --  130*  --   BILITOT 1.7* 1.8*  --  2.2*  --   --   --   --  2.5*  --    BILIDIR 1.0*  --   --   --   --   --   --   --   --   --   IBILI 0.7  --   --   --   --   --   --   --   --   --  TRIG 150*  --   --   --   --   --   --   --   --   --   CHOLHDL NOT CALCULATED  --   --   --   --   --   --   --   --   --   CHOL 85  --   --   --   --   --   --   --   --   --   < > = values in this interval not displayed. Estimated Creatinine Clearance: 50.4 mL/min (by C-G formula based on Cr of 1.52).    Recent Labs  02/05/16 2326 02/06/16 0344 02/06/16 0757  GLUCAP 190* 174* 164*    Medical History: Past Medical History  Diagnosis Date  . Hypertension   . Diabetes mellitus without complication (HCC)   . 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 sodium chloride   Intravenous Once  . alteplase  2 mg Intracatheter Once  . alteplase  2 mg Intracatheter Once  . antiseptic oral rinse  7 mL Mouth Rinse QID  . calcium gluconate  1 g Intravenous Once  . chlorhexidine gluconate  15 mL Mouth Rinse BID  . dextrose  25 mL Intravenous Once  . hydrocortisone sod succinate (SOLU-CORTEF) inj  50 mg Intravenous Q6H  . insulin aspart  0-15 Units Subcutaneous 6 times per day  . insulin glargine  10 Units Subcutaneous Daily  . magnesium sulfate 1 - 4 g bolus IVPB  2 g Intravenous Once  . pantoprazole (PROTONIX) IV  40 mg Intravenous Daily  . piperacillin-tazobactam (ZOSYN)  IV  3.375 g Intravenous 3 times per day  . potassium chloride  10 mEq Intravenous Q1 Hr x 4  . thiamine IV  100 mg Intravenous Daily   Infusions:  . epinephrine 20 mcg/min (02/06/16 0646)  . fentaNYL infusion INTRAVENOUS 50 mcg/hr (02/06/16 0600)  . norepinephrine (LEVOPHED) Adult infusion 50.987 mcg/min (02/06/16 0600)  . pureflow 2,000 mL/hr at 02/06/16 0333  . vasopressin (PITRESSIN) infusion - *FOR SHOCK* 0.03 Units/min (02/06/16 0600)    Insulin Requirements in the past 24 hours:    Current Nutrition:    Assessment: 65 y/o M admitted with severe  pancreatitis and renal failure on CRRT with orders to begin TPN.   K= 2.9, Mag= 1.6, Phos= 4.7, Ca= 6.3, albumin= 2.7, CCa= 8.3  Plan:  Patient is ordered SSI and Lantus 10 units daily. Potassium replaced with 10 meq IV x 4 runs. Will replace magnesium with 2 grams iv once. Will f/u next labs.   Jay Mejia D 02/06/2016,10:34 AM

## 2016-02-06 NOTE — Progress Notes (Signed)
[facility] [place] [facility]   PATIENT NAME: Jay Mejia    MR#:  [id number]  DATE OF BIRTH:  29-Jun-1951  SUBJECTIVE:   Remains critically ill with multiorgan failure. Off Dopamine now.  FiO2 down to 75% and PEEP to 16.  Remains Anuric.   Acidosis is improving. Remains anemic/thrombocytopenic.   REVIEW OF SYSTEMS:  Unable to obtain given medical condition/mental status  DRUG ALLERGIES:  No Known Allergies  VITALS:  Blood pressure 74/46, pulse 73, temperature 97.3 F (36.3 C), temperature source Rectal, resp. rate 25, height 5\' 7"  (1.702 m), weight 87 kg (191 lb 12.8 oz), SpO2 97 %.  PHYSICAL EXAMINATION:   VITAL SIGNS: Filed Vitals:   02/06/16 1400 02/06/16 1500  BP:    Pulse: 72 73  Temp: 97.2 F (36.2 C) 97.3 F (36.3 C)  Resp: 23 25    GENERAL:64 y.o.male critically ill, intubated and mechanically ventilated  HEAD: Normocephalic, atraumatic.  EYES: Pupils equal, round,  sluggishly reactive to light. Unable to assess extraocular muscles given mental status/medical condition. + scleral icterus.  MOUTH: Dry oral mucosa. Dentition intact. No abscess noted. ET/OG tube in place.  EAR, NOSE, THROAT: Clear without exudates. No external lesions.  NECK: Supple. No thyromegaly. No nodules. No JVD.  PULMONARY:  coarse breath sounds left greater than right without wheeze rales or rhonchi. No use of accessory muscles,poor respiratory effort. good air entry bilaterally CHEST: Nontender to palpation.  CARDIOVASCULAR: S1 and S2. Regular rate and rhythm. No murmurs, rubs, or gallops. No edema. Pedal pulses 2+ bilaterally.  GASTROINTESTINAL: Soft, nontender, nondistended. No masses. Positive bowel sounds. No hepatosplenomegaly.  MUSCULOSKELETAL: No swelling, clubbing, +1-2 edema b/l NEUROLOGIC: Unable to assess given mental status/medical condition SKIN: No ulceration, lesions, rashes, or cyanosis. Skin warm and dry. Turgor intact.  PSYCHIATRIC: Unable  to assess given mental status/medical condition   LABORATORY PANEL:   CBC  Recent Labs Lab 02/06/16 0333  WBC 14.2*  HGB 7.3*  HCT 23.0*  PLT 26*  28*   ------------------------------------------------------------------------------------------------------------------  Chemistries   Recent Labs Lab 02/06/16 0333  02/06/16 1216  NA 124*  124*  < > 130*  K 3.6  3.6  < > 3.8  CL 87*  88*  < > 96*  CO2 17*  16*  < > 23  GLUCOSE 185*  168*  < > 129*  BUN 24*  26*  < > 17  CREATININE 2.70*  2.79*  < > 1.52*  CALCIUM 5.9*  5.9*  < > 6.6*  MG 1.7  < > 1.6*  AST 2208*  --   --   ALT 305*  --   --   ALKPHOS 130*  --   --   BILITOT 2.5*  --   --   < > = values in this interval not displayed. ------------------------------------------------------------------------------------------------------------------  Cardiac Enzymes  Recent Labs Lab 02/04/16 2018  TROPONINI 0.84*   ------------------------------------------------------------------------------------------------------------------  RADIOLOGY:  Dg Chest Port 1 View  02/06/2016  CLINICAL DATA:  Respiratory failure EXAM: PORTABLE CHEST 1 VIEW COMPARISON:  Yesterday FINDINGS: Endotracheal and orogastric tubes remain in good position. Stable right subclavian catheter, tip at the SVC level. Bilateral airspace disease is borderline improved. The opacity correlates with history of ARDS. No cardiogenic edema, effusion, or pneumothorax. IMPRESSION: 1. Stable positioning of tubes and central line. 2. Borderline improvement in aeration since yesterday. Bilateral airspace disease correlating with history of ARDS. Electronically Signed   By: Kathrynn DuckingJonathon  Watts M.D.  On: 02/06/2016 03:15   Dg Chest Port 1 View  02/05/2016  CLINICAL DATA:  Respiratory failure. EXAM: PORTABLE CHEST 1 VIEW COMPARISON:  February 04, 2016. FINDINGS: Stable cardiomediastinal silhouette. Endotracheal nasogastric tubes are unchanged in position. Right  subclavian catheter is unchanged. No pneumothorax is noted. Stable mixed airspace and interstitial densities are noted throughout both lungs concerning for edema or possibly pneumonia. Minimal bilateral pleural effusions may be present. Bony thorax unremarkable. IMPRESSION: Stable support apparatus. Stable bilateral diffuse lung opacities are noted concerning for edema or pneumonia. Electronically Signed   By: Lupita Raider, M.D.   On: 02/05/2016 07:59    ASSESSMENT AND PLAN:   66 -year-old Caucasian gentleman admitted 01/22/2016 with apparent alcoholic pancreatitis, to get him by acute kidney injury, hyperkalemia. It appears that he is under resisted from volume standpoint  1. Post cardiac arrest: likely Related to hyperkalemia patient was not on off unit telemetry  - potassium has improved.  No arrhythmia.  Echo showing normal EF of 50-55%.   2. Acute respiratory failure hypoxemic requiring mechanical ventilation: This is probably due to underlying ARDS post cardiac arrest and underlying acute pancreatitis. -Continue full vent support, patient currently on 75 % FiO2 with 16 of PEEP. -Pulmonary following and defer vent management to them.    3. Acute kidney injury/ATN: With metabolic acidosis. Status post initiation of CRRT. Remains anuric. About 12 L + since admission.  -Remains hemodynamically unstable on 2 vasopressors and will continue CRRT and further care as per nephrology.  Cont. Fluid removal as per nephrology.   4. Alcoholic pancreatitis:  - cont. Supportive care w/ IV fluids, vasopressors.   5. Anemia/thrombocytopenia-due to underlying DIC/sepsis -Continue supportive care and follow counts. Transfuse as needed. Hold anticoagulants.  6. Shock-hypovolemic/septic. -Continue IV fluids, vasopressors, broad-spectrum IV antibiotics with Zosyn.  7. Hypokalemia - correct w/ CRRT as per Nephrology and will monitor.   Pt. Remains critically ill with multi-organ failure and high risk for  Cardioresp. Death. Explained to Son at bedside and he is aware.   All the records are reviewed and case discussed with Care Management/Social Workerr. Management plans discussed with the patient, family and they are in agreement.  CODE STATUS:full  TOTAL Critical Care TIME TAKING CARE OF THIS PATIENT: 30 min.    POSSIBLE D/C unclear and depends on pt's hospital course.    Houston Siren M.D on 02/06/2016 at 3:39 PM  Between 7am to 6pm - Pager - [phone]  After 6pm: House Pager: - [phone]  [facility]  Office  [phone]  CC: Primary care physician; Clydie Braun, MD

## 2016-02-06 NOTE — Progress Notes (Signed)
Nutrition Follow-up    INTERVENTION:   PN: pt has triple lumen right subclavian CVC with dedicated port for TPN at present per Madelaine BhatAdam RN; recommend starting 5%AA/25%Dextrose at rate of 40 ml/hr; initial goal rate of 83 ml/hr (2 Liters of volume) if pt able to tolerate, provides 100 g of protein, 1992 mL of fluid, 1755 kcals. Meets 100% calorie needs, 75% protein needs. Continue to assess EN: recommend initiation of trophic feedings via OG as soon as clinically feasible   NUTRITION DIAGNOSIS:   Inadequate oral intake related to acute illness as evidenced by NPO status. Being addressed via TPN  GOAL:   Provide needs based on ASPEN/SCCM guidelines  MONITOR:   Vent status, Labs, I & O's, Weight trends (Energy Intake)  REASON FOR ASSESSMENT:   Ventilator    ASSESSMENT:    Pt remains critically ill with multi organ failure on vent, ARDS, shock (likely septic and cardiogenic), acute pancreatitis, possible cholecystitis with cirrhotic changes on abdominal US, ARF on CRRT with UF 100 ml/hr, potassium bath changed from 2K+ to 4K+ this AM, currently on 3 pressors, pharmacy and dietitian consulted for TPN  Diet Order:    NPO  Digestive System: abdomen distended, taut, faint BS, OG to LIS with minimal output  Skin:  Reviewed, no issues   Recent Labs Lab 02/05/16 0238 02/05/16 0644  02/05/16 2315 02/06/16 0333 02/06/16 0705  NA  --  132*  132*  < > 129* 124*  124* 127*  K  --  4.6  4.6  < > 3.8 3.6  3.6 2.9*  CL  --  97*  97*  < > 92* 88*  87* 92*  CO2  --  11*  12*  < > 19* 16*  17* 22  BUN  --  39*  39*  < > 27* 26*  24* 16  CREATININE  --  3.78*  3.77*  < > 2.76* 2.79*  2.70* 1.52*  CALCIUM  --  6.6*  6.7*  < > 6.1* 5.9*  5.9* 6.3*  MG  --  1.3*  < > 1.7 1.7 1.6*  PHOS 7.8* 7.6*  --   --  4.7*  --   GLUCOSE  --  106*  105*  < > 178* 168*  185* 160*  < > = values in this interval not displayed.  Glucose Profile:   Recent Labs  02/06/16 0344  02/06/16 0757 02/06/16 1133  GLUCAP 174* 164* 143*   Nutritional Anemia Profile:  CBC Latest Ref Rng 02/06/2016 02/05/2016 02/05/2016  WBC 3.8 - 10.6 K/uL 14.2(H) - 7.4  Hemoglobin 13.0 - 18.0 g/dL 7.3(L) - 8.3(L)  Hematocrit 40.0 - 52.0 % 23.0(L) - 26.0(L)  Platelets 150 - 440 K/uL 28(LL) 31(L) 45(L)    Protein Profile:   Recent Labs Lab 02/05/16 0237 02/05/16 0644 02/06/16 0333  ALBUMIN 3.0* 3.1* 2.7*   Lipase     Component Value Date/Time   LIPASE 1070* 02/05/2016 0237    Meds: ss novolog, epinephrine, lantus, levophed, potassium chloride, vasopressin  Height:   Ht Readings from Last 1 Encounters:  02/12/2016 5\' 7"  (1.702 m)    Weight:   Wt Readings from Last 1 Encounters:  02/06/16 191 lb 12.8 oz (87 kg)    Filed Weights   01/22/2016 1500 02/05/16 0455 02/06/16 0500  Weight: 171 lb 1.6 oz (77.61 kg) 185 lb 3 oz (84 kg) 191 lb 12.8 oz (87 kg)    BMI:  Body mass index is 30.03 kg/(m^2).  Estimated Nutritional Needs:   Kcal:  1794 kcals (Ve: 12.5, Tmax: 36.4)   Protein:  131-174 g (1.5-2.0 g/kg)   Fluid:  2175-2610 mL (25-30 ml/kg)   HIGH Care Level  Romelle Starcher MS, RD, LDN [phone] Pager  [phone] Weekend/On-Call Pager

## 2016-02-06 NOTE — Progress Notes (Signed)
RN REFUSED 

## 2016-02-06 NOTE — Progress Notes (Signed)
Subjective:  Patient remains critically ill at this point in time. Continuous renal replacement therapy progressing well. Patient remains oligoanuric.     Objective:  Vital signs in last 24 hours:  Temp:  [95.7 F (35.4 C)-97.5 F (36.4 C)] 95.7 F (35.4 C) (03/17 0645) Pulse Rate:  [29-85] 73 (03/17 0645) Resp:  [20-36] 26 (03/17 0645) SpO2:  [80 %-100 %] 99 % (03/17 0733) Arterial Line BP: (81-120)/(42-57) 103/54 mmHg (03/17 0645) FiO2 (%):  [80 %-100 %] 80 % (03/17 0733) Weight:  [87 kg (191 lb 12.8 oz)] 87 kg (191 lb 12.8 oz) (03/17 0500)  Weight change: 3 kg (6 lb 9.8 oz) Filed Weights   02/06/2016 1500 02/05/16 0455 02/06/16 0500  Weight: 77.61 kg (171 lb 1.6 oz) 84 kg (185 lb 3 oz) 87 kg (191 lb 12.8 oz)    Intake/Output:    Intake/Output Summary (Last 24 hours) at 02/06/16 0845 Last data filed at 02/06/16 0800  Gross per 24 hour  Intake 8189.08 ml  Output   1977 ml  Net 6212.08 ml     Physical Exam: General: Critically ill  HEENT Eyes closed, ETT,  Neck supple  Pulm/lungs Coarse crackles b/l, Vent assisted  CVS/Heart S1S2 no rubs  Abdomen:  Soft, non distended, scant BS  Extremities: 1+ b/l LE edema  Neurologic: sedated  Skin: No acute rashes  Access: Right femoral dialysis catheter       Basic Metabolic Panel:   Recent Labs Lab 02/04/16 2018 02/04/16 2315  02/05/16 0238 02/05/16 0644 02/05/16 1049 02/05/16 1539 02/05/16 2012 02/05/16 2315 02/06/16 0333  NA 129* 129*  129*  < >  --  132*  132* 131* 130* 130* 129* 124*  124*  K 5.4* 5.1  5.0  < >  --  4.6  4.6 4.6 4.2 3.9 3.8 3.6  3.6  CL 98* 96*  97*  < >  --  97*  97* 95* 94* 93* 92* 88*  87*  CO2 11* 10*  12*  < >  --  11*  12* 13* 15* 16* 19* 16*  17*  GLUCOSE 119* 110*  113*  < >  --  106*  105* 115* 150* 166* 178* 168*  185*  BUN 50* 46*  47*  < >  --  39*  39* 38* 32* 30* 27* 26*  24*  CREATININE 4.44* 4.10*  4.12*  < >  --  3.78*  3.77* 3.71* 3.24* 3.08* 2.76*  2.79*  2.70*  CALCIUM 6.9* 6.8*  6.8*  < >  --  6.6*  6.7* 6.5* 6.2* 6.2* 6.1* 5.9*  5.9*  MG 1.4* 1.4*  < >  --  1.3* 1.3* 1.6* 1.5* 1.7 1.7  PHOS 8.3* 8.1*  --  7.8* 7.6*  --   --   --   --  4.7*  < > = values in this interval not displayed.   CBC:  Recent Labs Lab 02/04/16 0502 02/04/16 0905 02/04/16 2018 02/05/16 0237 02/05/16 1049 02/06/16 0333  WBC 9.7 12.5* 9.5 7.4  --  14.2*  HGB 9.8* 9.9* 8.7* 8.3*  --  7.3*  HCT 29.2* 30.8* 27.8* 26.0*  --  23.0*  MCV 104.5* 103.8* 109.6* 109.3*  --  105.6*  PLT 81* 80* 54* 45* 31* 28*      Microbiology:  Recent Results (from the past 720 hour(s))  MRSA PCR Screening     Status: None   Collection Time: 02/04/16  8:39 AM  Result Value  Ref Range Status   MRSA by PCR NEGATIVE NEGATIVE Final    Comment:        The GeneXpert MRSA Assay (FDA approved for NASAL specimens only), is one component of a comprehensive MRSA colonization surveillance program. It is not intended to diagnose MRSA infection nor to guide or monitor treatment for MRSA infections.   Culture, blood (Routine X 2) w Reflex to ID Panel     Status: None (Preliminary result)   Collection Time: 02/04/16 12:34 PM  Result Value Ref Range Status   Specimen Description BLOOD RIGHT ARM  Final   Special Requests   Final    BOTTLES DRAWN AEROBIC AND ANAEROBIC AER ANA   Culture NO GROWTH < 24 HOURS  Final   Report Status PENDING  Incomplete  Culture, blood (Routine X 2) w Reflex to ID Panel     Status: None (Preliminary result)   Collection Time: 02/04/16 12:49 PM  Result Value Ref Range Status   Specimen Description BLOOD RIGHT HAND  Final   Special Requests   Final    BOTTLES DRAWN AEROBIC AND ANAEROBIC AER ANA   Culture NO GROWTH < 24 HOURS  Final   Report Status PENDING  Incomplete  Culture, respiratory (NON-Expectorated)     Status: None (Preliminary result)   Collection Time: 02/04/16  2:50 PM  Result Value Ref Range Status   Specimen  Description TRACHEAL ASPIRATE  Final   Special Requests NONE  Final   Gram Stain PENDING  Incomplete   Culture HOLDING FOR POSSIBLE PATHOGEN  Final   Report Status PENDING  Incomplete    Coagulation Studies:  Recent Labs  02/04/16 0905 02/05/16 1049  LABPROT 20.0* 49.0*  INR 1.70 5.61*    Urinalysis: No results for input(s): COLORURINE, LABSPEC, PHURINE, GLUCOSEU, HGBUR, BILIRUBINUR, KETONESUR, PROTEINUR, UROBILINOGEN, NITRITE, LEUKOCYTESUR in the last 72 hours.  Invalid input(s): APPERANCEUR    Imaging: Dg Chest 1 View  02/04/2016  CLINICAL DATA:  Post intubation and OG tube placement. EXAM: CHEST 1 VIEW COMPARISON:  None. FINDINGS: Endotracheal tube has tip 4.3 cm above the carina. Enteric tube has tip and proximal port over the stomach in the left upper quadrant. Right subclavian central venous catheter has tip overlying the SVC. External defibrillator pads over the lower left chest wall. Lungs are adequately inflated with moderate bilateral perihilar opacification likely moderate interstitial edema. No definite effusion. Borderline cardiomegaly. Mild degenerative change of the spine. IMPRESSION: Moderate bilateral perihilar opacification likely moderate interstitial edema. Borderline cardiomegaly. Tubes and lines as described. Electronically Signed   By: Elberta Fortis M.D.   On: 02/04/2016 12:46   Dg Abd 1 View  02/04/2016  CLINICAL DATA:  Orogastric tube placement EXAM: ABDOMEN - 1 VIEW COMPARISON:  03/01/2014 FINDINGS: Orogastric tube tip is in the fundus of the stomach. No disproportionate dilatation bowel in the abdomen. No obvious free intraperitoneal gas. Patchy opacities of both lung bases are noted. IMPRESSION: Orogastric tube tip is in the fundus of the stomach. Electronically Signed   By: Jolaine Click M.D.   On: 02/04/2016 12:45   Dg Chest Port 1 View  02/06/2016  CLINICAL DATA:  Respiratory failure EXAM: PORTABLE CHEST 1 VIEW COMPARISON:  Yesterday FINDINGS: Endotracheal  and orogastric tubes remain in good position. Stable right subclavian catheter, tip at the SVC level. Bilateral airspace disease is borderline improved. The opacity correlates with history of ARDS. No cardiogenic edema, effusion, or pneumothorax. IMPRESSION: 1. Stable positioning of tubes and  central line. 2. Borderline improvement in aeration since yesterday. Bilateral airspace disease correlating with history of ARDS. Electronically Signed   By: Marnee SpringJonathon  Watts M.D.   On: 02/06/2016 03:15   Dg Chest Port 1 View  02/05/2016  CLINICAL DATA:  Respiratory failure. EXAM: PORTABLE CHEST 1 VIEW COMPARISON:  February 04, 2016. FINDINGS: Stable cardiomediastinal silhouette. Endotracheal nasogastric tubes are unchanged in position. Right subclavian catheter is unchanged. No pneumothorax is noted. Stable mixed airspace and interstitial densities are noted throughout both lungs concerning for edema or possibly pneumonia. Minimal bilateral pleural effusions may be present. Bony thorax unremarkable. IMPRESSION: Stable support apparatus. Stable bilateral diffuse lung opacities are noted concerning for edema or pneumonia. Electronically Signed   By: Lupita RaiderJames  Green Jr, M.D.   On: 02/05/2016 07:59     Medications:   . DOPamine Stopped (02/06/16 0000)  . epinephrine 20 mcg/min (02/06/16 0646)  . fentaNYL infusion INTRAVENOUS 50 mcg/hr (02/06/16 0600)  . norepinephrine (LEVOPHED) Adult infusion 50.987 mcg/min (02/06/16 0600)  . pureflow 2,000 mL/hr at 02/06/16 0333  .  sodium bicarbonate  infusion 1000 mL 200 mL/hr at 02/06/16 0646  . vasopressin (PITRESSIN) infusion - *FOR SHOCK* 0.03 Units/min (02/06/16 0600)   . alteplase  2 mg Intracatheter Once  . alteplase  2 mg Intracatheter Once  . antiseptic oral rinse  7 mL Mouth Rinse QID  . calcium gluconate  1 g Intravenous Once  . chlorhexidine gluconate  15 mL Mouth Rinse BID  . dextrose  25 mL Intravenous Once  . hydrocortisone sod succinate (SOLU-CORTEF) inj  50 mg  Intravenous Q6H  . insulin aspart  0-15 Units Subcutaneous 6 times per day  . insulin aspart  10 Units Intravenous Once  . pantoprazole (PROTONIX) IV  40 mg Intravenous Daily  . piperacillin-tazobactam (ZOSYN)  IV  3.375 g Intravenous 3 times per day  . thiamine IV  100 mg Intravenous Daily   bisacodyl, fentaNYL, heparin, midazolam, ondansetron (ZOFRAN) IV  Assessment/ Plan:  65 y.o. male with a PMHX of Coronary disease, cirrhosis, Esophageal varices, portal hypertensive gastropathy, type 2 diabetes, hypertension, left carotid surgery. peripheral vascular disease, was admitted on 01/26/2016 with abdominal pain/Acute pancreatitis.  3/15- AM - Code blue - transferred to ICU - CRRT started 3/16- Multiple pressors- CRRT contd. anuric  1. Acute renal failure. (Baseline Cr 0.9) with multiple organ failure, due to ATN.  Likely secondary to severe ATN from Acute pancreatitis secondary to alcohol use  -Patient seems to be progressing well with continuous renal replacement therapy. We will switch the patient over to a 4 potassium bath. Continue ultrafiltration rate of 100 cc per hour. Continue to monitor O lites closely.  2. High anion gap acidosis Lactic acidosis  -PH currently up to 7.18 which is shown some improvement. Continue continuous renal placement therapy.  3. Hyperkalemia Potassium actually low now. Patient will be switched a 4 potassium bath as above.   4. Acute respiratory failure. Continue ventilatory support.     LOS: 3 Lilia Letterman 3/17/20178:45 AM

## 2016-02-07 ENCOUNTER — Inpatient Hospital Stay: Payer: Medicare Other

## 2016-02-07 DIAGNOSIS — D65 Disseminated intravascular coagulation [defibrination syndrome]: Secondary | ICD-10-CM

## 2016-02-07 LAB — LACTATE DEHYDROGENASE: LDH: 1652 U/L — ABNORMAL HIGH (ref 98–192)

## 2016-02-07 LAB — BASIC METABOLIC PANEL
ANION GAP: 14 (ref 5–15)
ANION GAP: 11 (ref 5–15)
ANION GAP: 11 (ref 5–15)
ANION GAP: 10 (ref 5–15)
Anion gap: 7 (ref 5–15)
Anion gap: 9 (ref 5–15)
BUN: 20 mg/dL (ref 6–20)
BUN: 20 mg/dL (ref 6–20)
BUN: 20 mg/dL (ref 6–20)
BUN: 21 mg/dL — ABNORMAL HIGH (ref 6–20)
BUN: 21 mg/dL — AB (ref 6–20)
BUN: 21 mg/dL — AB (ref 6–20)
CALCIUM: 7.1 mg/dL — AB (ref 8.9–10.3)
CALCIUM: 7.6 mg/dL — AB (ref 8.9–10.3)
CALCIUM: 7.5 mg/dL — AB (ref 8.9–10.3)
CHLORIDE: 95 mmol/L — AB (ref 101–111)
CHLORIDE: 96 mmol/L — AB (ref 101–111)
CO2: 20 mmol/L — AB (ref 22–32)
CO2: 21 mmol/L — AB (ref 22–32)
CO2: 19 mmol/L — ABNORMAL LOW (ref 22–32)
CO2: 20 mmol/L — AB (ref 22–32)
CO2: 23 mmol/L (ref 22–32)
CO2: 20 mmol/L — ABNORMAL LOW (ref 22–32)
CREATININE: 2.07 mg/dL — AB (ref 0.61–1.24)
CREATININE: 2.01 mg/dL — AB (ref 0.61–1.24)
CREATININE: 1.57 mg/dL — AB (ref 0.61–1.24)
Calcium: 7 mg/dL — ABNORMAL LOW (ref 8.9–10.3)
Calcium: 7.4 mg/dL — ABNORMAL LOW (ref 8.9–10.3)
Calcium: 7.1 mg/dL — ABNORMAL LOW (ref 8.9–10.3)
Chloride: 98 mmol/L — ABNORMAL LOW (ref 101–111)
Chloride: 98 mmol/L — ABNORMAL LOW (ref 101–111)
Chloride: 99 mmol/L — ABNORMAL LOW (ref 101–111)
Chloride: 100 mmol/L — ABNORMAL LOW (ref 101–111)
Creatinine, Ser: 1.87 mg/dL — ABNORMAL HIGH (ref 0.61–1.24)
Creatinine, Ser: 1.82 mg/dL — ABNORMAL HIGH (ref 0.61–1.24)
Creatinine, Ser: 1.49 mg/dL — ABNORMAL HIGH (ref 0.61–1.24)
GFR calc Af Amer: 42 mL/min — ABNORMAL LOW (ref >60)
GFR calc Af Amer: 51 mL/min — ABNORMAL LOW (ref >60)
GFR calc Af Amer: 55 mL/min — ABNORMAL LOW (ref >60)
GFR calc non Af Amer: 32 mL/min — ABNORMAL LOW (ref >60)
GFR calc non Af Amer: 33 mL/min — ABNORMAL LOW (ref >60)
GFR, EST AFRICAN AMERICAN: 37 mL/min — AB (ref >60)
GFR, EST AFRICAN AMERICAN: 38 mL/min — AB (ref >60)
GFR, EST AFRICAN AMERICAN: 43 mL/min — AB (ref >60)
GFR, EST NON AFRICAN AMERICAN: 36 mL/min — AB (ref >60)
GFR, EST NON AFRICAN AMERICAN: 37 mL/min — AB (ref >60)
GFR, EST NON AFRICAN AMERICAN: 44 mL/min — AB (ref >60)
GFR, EST NON AFRICAN AMERICAN: 47 mL/min — AB (ref >60)
GLUCOSE: 251 mg/dL — AB (ref 65–99)
GLUCOSE: 247 mg/dL — AB (ref 65–99)
GLUCOSE: 243 mg/dL — AB (ref 65–99)
Glucose, Bld: 219 mg/dL — ABNORMAL HIGH (ref 65–99)
Glucose, Bld: 242 mg/dL — ABNORMAL HIGH (ref 65–99)
Glucose, Bld: 247 mg/dL — ABNORMAL HIGH (ref 65–99)
POTASSIUM: 4.2 mmol/L (ref 3.5–5.1)
POTASSIUM: 4.2 mmol/L (ref 3.5–5.1)
POTASSIUM: 4.4 mmol/L (ref 3.5–5.1)
Potassium: 4.2 mmol/L (ref 3.5–5.1)
Potassium: 4.3 mmol/L (ref 3.5–5.1)
Potassium: 4.1 mmol/L (ref 3.5–5.1)
SODIUM: 129 mmol/L — AB (ref 135–145)
SODIUM: 128 mmol/L — AB (ref 135–145)
Sodium: 128 mmol/L — ABNORMAL LOW (ref 135–145)
Sodium: 128 mmol/L — ABNORMAL LOW (ref 135–145)
Sodium: 129 mmol/L — ABNORMAL LOW (ref 135–145)
Sodium: 129 mmol/L — ABNORMAL LOW (ref 135–145)

## 2016-02-07 LAB — GLUCOSE, CAPILLARY
GLUCOSE-CAPILLARY: 209 mg/dL — AB (ref 65–99)
GLUCOSE-CAPILLARY: 222 mg/dL — AB (ref 65–99)
Glucose-Capillary: 196 mg/dL — ABNORMAL HIGH (ref 65–99)
Glucose-Capillary: 219 mg/dL — ABNORMAL HIGH (ref 65–99)
Glucose-Capillary: 222 mg/dL — ABNORMAL HIGH (ref 65–99)
Glucose-Capillary: 226 mg/dL — ABNORMAL HIGH (ref 65–99)
Glucose-Capillary: 226 mg/dL — ABNORMAL HIGH (ref 65–99)

## 2016-02-07 LAB — LIPASE, BLOOD
Lipase: 143 U/L — ABNORMAL HIGH (ref 11–51)
Lipase: 112 U/L — ABNORMAL HIGH (ref 11–51)

## 2016-02-07 LAB — CBC
HEMATOCRIT: 26.7 % — AB (ref 40.0–52.0)
HEMOGLOBIN: 8.8 g/dL — AB (ref 13.0–18.0)
MCH: 32.2 pg (ref 26.0–34.0)
MCHC: 33 g/dL (ref 32.0–36.0)
MCV: 97.6 fL (ref 80.0–100.0)
Platelets: 20 10*3/uL — CL (ref 150–440)
RBC: 2.73 MIL/uL — ABNORMAL LOW (ref 4.40–5.90)
RDW: 19.6 % — ABNORMAL HIGH (ref 11.5–14.5)
WBC: 25.7 10*3/uL — ABNORMAL HIGH (ref 3.8–10.6)

## 2016-02-07 LAB — COMPREHENSIVE METABOLIC PANEL
ALBUMIN: 2.4 g/dL — AB (ref 3.5–5.0)
ALT: 577 U/L — ABNORMAL HIGH (ref 17–63)
ANION GAP: 11 (ref 5–15)
AST: 2466 U/L — ABNORMAL HIGH (ref 15–41)
Alkaline Phosphatase: 205 U/L — ABNORMAL HIGH (ref 38–126)
BILIRUBIN TOTAL: 5.7 mg/dL — AB (ref 0.3–1.2)
BUN: 21 mg/dL — ABNORMAL HIGH (ref 6–20)
CHLORIDE: 96 mmol/L — AB (ref 101–111)
CO2: 21 mmol/L — AB (ref 22–32)
Calcium: 7.1 mg/dL — ABNORMAL LOW (ref 8.9–10.3)
Creatinine, Ser: 1.85 mg/dL — ABNORMAL HIGH (ref 0.61–1.24)
GFR calc Af Amer: 42 mL/min — ABNORMAL LOW (ref >60)
GFR calc non Af Amer: 36 mL/min — ABNORMAL LOW (ref >60)
GLUCOSE: 251 mg/dL — AB (ref 65–99)
POTASSIUM: 4.3 mmol/L (ref 3.5–5.1)
SODIUM: 128 mmol/L — AB (ref 135–145)
TOTAL PROTEIN: 5 g/dL — AB (ref 6.5–8.1)

## 2016-02-07 LAB — APTT: aPTT: 49 seconds — ABNORMAL HIGH (ref 24–36)

## 2016-02-07 LAB — TYPE AND SCREEN
ABO/RH(D): O POS
ANTIBODY SCREEN: NEGATIVE
Unit division: 0

## 2016-02-07 LAB — MAGNESIUM
MAGNESIUM: 1.8 mg/dL (ref 1.7–2.4)
MAGNESIUM: 1.8 mg/dL (ref 1.7–2.4)
MAGNESIUM: 1.7 mg/dL (ref 1.7–2.4)
MAGNESIUM: 1.7 mg/dL (ref 1.7–2.4)
Magnesium: 1.7 mg/dL (ref 1.7–2.4)
Magnesium: 1.7 mg/dL (ref 1.7–2.4)

## 2016-02-07 LAB — FIBRINOGEN: FIBRINOGEN: 79 mg/dL — AB (ref 210–470)

## 2016-02-07 LAB — PHOSPHORUS: PHOSPHORUS: 2.6 mg/dL (ref 2.5–4.6)

## 2016-02-07 LAB — PROTIME-INR
INR: 2.92
Prothrombin Time: 30 s — ABNORMAL HIGH (ref 11.4–15.0)

## 2016-02-07 LAB — FIBRIN DEGRADATION PROD.(ARMC ONLY): Fibrin Degradation Prod.: >40 — AB

## 2016-02-07 LAB — FIBRIN DERIVATIVES D-DIMER (ARMC ONLY): Fibrin derivatives D-dimer (ARMC): >10000 — ABNORMAL HIGH (ref 0–499)

## 2016-02-07 LAB — PROCALCITONIN: PROCALCITONIN: 2.58 ng/mL

## 2016-02-07 LAB — PLATELET COUNT: PLATELETS: 19 10*3/uL — AB (ref 150–440)

## 2016-02-07 MED ORDER — SODIUM CHLORIDE 0.9 % IV SOLN
2.0000 g | Freq: Once | INTRAVENOUS | Status: AC
  Administered 2016-02-07: 2 g via INTRAVENOUS
  Filled 2016-02-07: qty 20

## 2016-02-07 MED ORDER — TRACE MINERALS CR-CU-MN-SE-ZN 10-1000-500-60 MCG/ML IV SOLN
INTRAVENOUS | Status: AC
  Administered 2016-02-07: 17:00:00 via INTRAVENOUS
  Filled 2016-02-07: qty 960

## 2016-02-07 NOTE — Progress Notes (Addendum)
Subjective:  As before patient remains critically ill. Patient and uric at the moment. Ultrafiltration rate was increased gradually yesterday. We discussed this with nursing this a.m. Patient remains on the ventilator.   Objective:  Vital signs in last 24 hours:  Temp:  [95.9 F (35.5 C)-99 F (37.2 C)] 98.2 F (36.8 C) (03/18 0600) Pulse Rate:  [69-75] 70 (03/18 0600) Resp:  [17-36] 35 (03/18 0600) SpO2:  [95 %-100 %] 100 % (03/18 0600) Arterial Line BP: (88-112)/(45-57) 100/48 mmHg (03/18 0600) FiO2 (%):  [75 %] 75 % (03/18 0315) Weight:  [96 kg (211 lb 10.3 oz)] 96 kg (211 lb 10.3 oz) (03/18 0434)  Weight change: 9 kg (19 lb 13.5 oz) Filed Weights   02/05/16 0455 02/06/16 0500 02/07/16 0434  Weight: 84 kg (185 lb 3 oz) 87 kg (191 lb 12.8 oz) 96 kg (211 lb 10.3 oz)    Intake/Output:    Intake/Output Summary (Last 24 hours) at 02/07/16 0746 Last data filed at 02/07/16 0600  Gross per 24 hour  Intake 3720.77 ml  Output   2954 ml  Net 766.77 ml     Physical Exam: General: Critically ill  HEENT Eyes closed, ETT  Neck supple  Pulm/lungs Coarse crackles b/l, Vent assisted  CVS/Heart S1S2 no rubs  Abdomen:  Soft, non distended, scant BS  Extremities: 2+ b/l LE edema  Neurologic: sedated  Skin: No acute rashes  Access: Right femoral dialysis catheter       Basic Metabolic Panel:   Recent Labs Lab 02/04/16 2315  02/05/16 0238 02/05/16 0644  02/06/16 0333  02/06/16 1216 02/06/16 1555 02/06/16 1942 02/06/16 2320 02/07/16 0305 02/07/16 0449  NA 129*  129*  < >  --  132*  132*  < > 124*  124*  < > 130* 127* 129* 129* 128* 128*  K 5.1  5.0  < >  --  4.6  4.6  < > 3.6  3.6  < > 3.8 4.3 4.3 4.2 4.2 4.3  CL 96*  97*  < >  --  97*  97*  < > 87*  88*  < > 96* 91* 95* 95* 96* 96*  CO2 10*  12*  < >  --  11*  12*  < > 17*  16*  < > 23 19* 21* 20* 21* 21*  GLUCOSE 110*  113*  < >  --  106*  105*  < > 185*  168*  < > 129* 125* 180* 219* 242* 251*   BUN 46*  47*  < >  --  39*  39*  < > 24*  26*  < > 17 23* 21*  CREATININE 4.10*  4.12*  < >  --  3.78*  3.77*  < > 2.70*  2.79*  < > 1.52* 2.26* 2.07* 2.07* 2.01* 1.85*  CALCIUM 6.8*  6.8*  < >  --  6.6*  6.7*  < > 5.9*  5.9*  < > 6.6* 6.3* 6.7* 7.0* 7.4* 7.1*  MG 1.4*  < >  --  1.3*  < > 1.7  < > 1.6* 1.9 1.8 1.7 1.7  --   PHOS 8.1*  --  7.8* 7.6*  --  4.7*  --   --   --   --   --   --  2.6  < > = values in this interval not displayed.   CBC:  Recent Labs Lab 02/04/16 0905 02/04/16 2018 02/05/16 0237 02/05/16 1049  02/06/16 0333 02/07/16 0449  WBC 12.5* 9.5 7.4  --  14.2* 25.7*  HGB 9.9* 8.7* 8.3*  --  7.3* 8.8*  HCT 30.8* 27.8* 26.0*  --  23.0* 26.7*  MCV 103.8* 109.6* 109.3*  --  105.6* 97.6  PLT 80* 54* 45* 31* 26*  28* 20*      Microbiology:  Recent Results (from the past 720 hour(s))  MRSA PCR Screening     Status: None   Collection Time: 02/04/16  8:39 AM  Result Value Ref Range Status   MRSA by PCR NEGATIVE NEGATIVE Final    Comment:        The GeneXpert MRSA Assay (FDA approved for NASAL specimens only), is one component of a comprehensive MRSA colonization surveillance program. It is not intended to diagnose MRSA infection nor to guide or monitor treatment for MRSA infections.   Culture, blood (Routine X 2) w Reflex to ID Panel     Status: None (Preliminary result)   Collection Time: 02/04/16 12:34 PM  Result Value Ref Range Status   Specimen Description BLOOD RIGHT ARM  Final   Special Requests   Final    BOTTLES DRAWN AEROBIC AND ANAEROBIC AER ANA   Culture NO GROWTH 3 DAYS  Final   Report Status PENDING  Incomplete  Culture, blood (Routine X 2) w Reflex to ID Panel     Status: None (Preliminary result)   Collection Time: 02/04/16 12:49 PM  Result Value Ref Range Status   Specimen Description BLOOD RIGHT HAND  Final   Special Requests   Final    BOTTLES DRAWN AEROBIC AND ANAEROBIC AER ANA   Culture NO GROWTH 3  DAYS  Final   Report Status PENDING  Incomplete  Culture, respiratory (NON-Expectorated)     Status: None (Preliminary result)   Collection Time: 02/04/16  2:50 PM  Result Value Ref Range Status   Specimen Description TRACHEAL ASPIRATE  Final   Special Requests NONE  Final   Gram Stain   Final    MODERATE GRAM POSITIVE COCCI IN CHAINS MODERATE RED BLOOD CELLS RARE WBC SEEN    Culture HOLDING FOR POSSIBLE PATHOGEN  Final   Report Status PENDING  Incomplete    Coagulation Studies:  Recent Labs  02/04/16 0905 02/05/16 1049 02/06/16 0333  LABPROT 20.0* 49.0* 48.2*  INR 1.70 5.61* 5.48*    Urinalysis: No results for input(s): COLORURINE, LABSPEC, PHURINE, GLUCOSEU, HGBUR, BILIRUBINUR, KETONESUR, PROTEINUR, UROBILINOGEN, NITRITE, LEUKOCYTESUR in the last 72 hours.  Invalid input(s): APPERANCEUR    Imaging: Dg Chest Port 1 View  02/06/2016  CLINICAL DATA:  Respiratory failure EXAM: PORTABLE CHEST 1 VIEW COMPARISON:  Yesterday FINDINGS: Endotracheal and orogastric tubes remain in good position. Stable right subclavian catheter, tip at the SVC level. Bilateral airspace disease is borderline improved. The opacity correlates with history of ARDS. No cardiogenic edema, effusion, or pneumothorax. IMPRESSION: 1. Stable positioning of tubes and central line. 2. Borderline improvement in aeration since yesterday. Bilateral airspace disease correlating with history of ARDS. Electronically Signed   By: Marnee Spring M.D.   On: 02/06/2016 03:15     Medications:   . Marland KitchenTPN (CLINIMIX-E) Adult 40 mL/hr at 02/06/16 1839  . epinephrine 9 mcg/min (02/07/16 1610)  . fentaNYL infusion INTRAVENOUS 50 mcg/hr (02/06/16 1900)  . norepinephrine (LEVOPHED) Adult infusion 50 mcg/min (02/07/16 0557)  . pureflow 2,000 mL/hr at 02/07/16 0431  . vasopressin (PITRESSIN) infusion - *FOR SHOCK* 0.03 Units/min (02/06/16 0600)   .  alteplase  2 mg Intracatheter Once  . alteplase  2 mg Intracatheter Once  .  antiseptic oral rinse  7 mL Mouth Rinse Q2H  . calcium gluconate  2 g Intravenous Once  . chlorhexidine gluconate  15 mL Mouth Rinse BID  . dextrose  25 mL Intravenous Once  . hydrocortisone sod succinate (SOLU-CORTEF) inj  50 mg Intravenous Q6H  . insulin aspart  0-15 Units Subcutaneous 6 times per day  . insulin glargine  10 Units Subcutaneous Daily  . pantoprazole (PROTONIX) IV  40 mg Intravenous Daily  . piperacillin-tazobactam (ZOSYN)  IV  3.375 g Intravenous 3 times per day  . sodium chloride flush  10-40 mL Intracatheter Q12H  . thiamine IV  100 mg Intravenous Daily   fentaNYL, heparin, midazolam, ondansetron (ZOFRAN) IV, sodium chloride flush  Assessment/ Plan:  65 y.o. male with a PMHX of Coronary disease, cirrhosis, Esophageal varices, portal hypertensive gastropathy, type 2 diabetes, hypertension, left carotid surgery. peripheral vascular disease, was admitted on 02/05/2016 with abdominal pain/Acute pancreatitis.  3/15- AM - Code blue - transferred to ICU - CRRT started 3/16- Multiple pressors- CRRT contd. anuric 3/17- UF rate adjusted to 210cc/hr  1. Acute renal failure. (Baseline Cr 0.9) with multiple organ failure, due to ATN.  Likely secondary to severe ATN from Acute pancreatitis secondary to alcohol use  -Ultrafiltration rate was increased yesterday. Currently ultrafiltration rate is 210 cc per hour. We will maintain this to make sure the patient is net negative over the next 24 hours. Titrate pressors as needed to maintain map greater than 65. Continue to monitor electrolytes closely.  2. High anion gap acidosis Lactic acidosis  -No new ABG at the moment. Recommend ABG be repeated.  3. Hyperkalemia Potassium stable at 4.3. Continue to monitor..   4. Acute respiratory failure. Patient with ARDS, continue ventilatory support. Patient being monitored by pulmonary critical care.   5.  Hyponatremia:  Serum sodium currently 128. We will increase dialysate rate to 2.5 L per  hour. Continue to monitor serum sodium.     LOS: 4 Torell Minder 3/18/20177:46 AM

## 2016-02-07 NOTE — Progress Notes (Signed)
PARENTERAL NUTRITION CONSULT NOTE - INITIAL  Pharmacy Consult for TPN Electrolyte and Glucose Monitoring  No Known Allergies  Patient Measurements: Height:  (170.2 cm) Weight: 191 lb 12.8 oz (87 kg) IBW/kg (Calculated) : 66.1  Vital Signs: Temp: 98.8 F (37.1 C) (03/18 0000) Temp Source: Rectal (03/17 1311) Pulse Rate: 73 (03/18 0000) Intake/Output from previous day: 03/17 0701 - 03/18 0700 In: 2269.6 [I.V.:1945.6; IV Piggyback:270; TPN:54] Out: 1748  Intake/Output from this shift: Total I/O In: 448.2 [I.V.:138.2; IV Piggyback:270; TPN:40] Out: 782 [Other:782]  Labs:  Recent Labs  02/04/16 0905 02/04/16 2018 02/05/16 0237 02/05/16 1049 02/06/16 0333  WBC 12.5* 9.5 7.4  --  14.2*  HGB 9.9* 8.7* 8.3*  --  7.3*  HCT 30.8* 27.8* 26.0*  --  23.0*  PLT 80* 54* 45* 31* 26*  28*  APTT 48*  --   --  88* 59*  INR 1.70  --   --  5.61* 5.48*     Recent Labs  02/04/16 0502 02/04/16 0905  02/05/16 0237 02/05/16 0238 02/05/16 0644  02/06/16 0333  02/06/16 1555 02/06/16 1942 02/06/16 2320  NA 128* 130*  < > 129*  --  132*  132*  < > 124*  124*  < > 127* 129* 129*  K 6.3* 6.7*  < > 4.7  --  4.6  4.6  < > 3.6  3.6  < > 4.3 4.3 4.2  CL 100* 102  < > 96*  --  97*  97*  < > 87*  88*  < > 91* 95* 95*  CO2 10* 11*  < > 11*  --  11*  12*  < > 17*  16*  < > 19* 21* 20*  GLUCOSE 70 80  < > 107*  --  106*  105*  < > 185*  168*  < > 125* 180* 219*  BUN 64* 66*  < > 42*  --  39*  39*  < > 24*  26*  < > 23* 20 20  CREATININE 5.74* 5.78*  < > 3.94*  --  3.78*  3.77*  < > 2.70*  2.79*  < > 2.26* 2.07* 2.07*  CALCIUM 6.8* 6.5*  < > 6.7*  --  6.6*  6.7*  < > 5.9*  5.9*  < > 6.3* 6.7* 7.0*  MG  --   --   < > 1.4*  --  1.3*  < > 1.7  < > 1.9 1.8 1.7  PHOS  --   --   < >  --  7.8* 7.6*  --  4.7*  --   --   --   --   PROT 6.2* 6.0*  --  5.7*  --   --   --  4.9*  --   --   --   --   ALBUMIN 2.5* 2.3*  < > 3.0*  --  3.1*  --  2.7*  --   --   --   --   AST 33 49*  --   466*  --   --   --  2208*  --   --   --   --   ALT 17 21  --  79*  --   --   --  305*  --   --   --   --   ALKPHOS 105 119  --  105  --   --   --  130*  --   --   --   --  BILITOT 1.7* 1.8*  --  2.2*  --   --   --  2.5*  --   --   --   --   BILIDIR 1.0*  --   --   --   --   --   --   --   --   --   --   --   IBILI 0.7  --   --   --   --   --   --   --   --   --   --   --   TRIG 150*  --   --   --   --   --   --   --   --   --   --   --   CHOLHDL NOT CALCULATED  --   --   --   --   --   --   --   --   --   --   --   CHOL 85  --   --   --   --   --   --   --   --   --   --   --   < > = values in this interval not displayed. Estimated Creatinine Clearance: 37 mL/min (by C-G formula based on Cr of 2.07).    Recent Labs  02/06/16 1643 02/06/16 2009 02/07/16 0018  GLUCAP 108* 137* 196*    Medical History: Past Medical History  Diagnosis Date  . Hypertension   . Diabetes mellitus without complication (HCC)   . 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 alteplase  2 mg Intracatheter Once  . alteplase  2 mg Intracatheter Once  . antiseptic oral rinse  7 mL Mouth Rinse Q2H  . calcium gluconate  2 g Intravenous Once  . chlorhexidine gluconate  15 mL Mouth Rinse BID  . dextrose  25 mL Intravenous Once  . hydrocortisone sod succinate (SOLU-CORTEF) inj  50 mg Intravenous Q6H  . insulin aspart  0-15 Units Subcutaneous 6 times per day  . insulin glargine  10 Units Subcutaneous Daily  . pantoprazole (PROTONIX) IV  40 mg Intravenous Daily  . piperacillin-tazobactam (ZOSYN)  IV  3.375 g Intravenous 3 times per day  . sodium chloride flush  10-40 mL Intracatheter Q12H  . thiamine IV  100 mg Intravenous Daily   Infusions:  . Marland Kitchen.TPN (CLINIMIX-E) Adult 40 mL/hr at 02/06/16 1839  . epinephrine 20 mcg/min (02/06/16 2137)  . fentaNYL infusion INTRAVENOUS 50 mcg/hr (02/06/16 1900)  . norepinephrine (LEVOPHED) Adult infusion 50 mcg/min (02/07/16 0038)  .  pureflow 6,000 mL/hr at 02/06/16 1127  . vasopressin (PITRESSIN) infusion - *FOR SHOCK* 0.03 Units/min (02/06/16 0600)    Insulin Requirements in the past 24 hours:    Current Nutrition:    Assessment: 65 y/o M admitted with severe pancreatitis and renal failure on CRRT with orders to begin TPN.   K= 4.3, Mag= 1.9, Phos= 4.7, Ca= 6.7, albumin= 2.7, CCa= 7.7  3/18 @ 1:00 :   K = 4.2, Mag = 1.7, Ca = 7.0, albumin = 2.7, CCa = 8.0   Plan:  Patient is ordered SSI and Lantus 10 units daily. K and Mg are WNL. Corrected calcium has improved some. Will give two more grams of calcium gluconate. Follow up with ionized ca. Follow up with labs at next check  3/18:   CCa =  8.0,  Will order Ca gluconate 2 gm IV X 1 and recheck electrolytes on 3/18 with AM labs.  Clinical Pharmacist 02/07/2016,1:05 AM

## 2016-02-07 NOTE — Progress Notes (Signed)
[facility] [place] [facility]   PATIENT NAME: Jay Mejia    MR#:  [id number]  DATE OF BIRTH:  [birth year]  SUBJECTIVE:   Remains critically ill with multiorgan failure. Off Dopamine now.  FiO2 down to 75% and PEEP to 16.  Remains Anuric.Has frank hematuria   Acidosis is improving. Remains anemic/thrombocytopenic.   REVIEW OF SYSTEMS:  Unable to obtain given medical condition/mental status  DRUG ALLERGIES:  No Known Allergies  VITALS:  Blood pressure 74/46, pulse 70, temperature 98.2 F (36.8 C), temperature source Rectal, resp. rate 35, height 5\' 7"  (1.702 m), weight 96 kg (211 lb 10.3 oz), SpO2 100 %.  PHYSICAL EXAMINATION:   VITAL SIGNS: Filed Vitals:   02/07/16 0500 02/07/16 0600  BP:    Pulse: 71 70  Temp: 98.4 F (36.9 C) 98.2 F (36.8 C)  Resp: 35 35    GENERAL:64 y.o.male critically ill, intubated and mechanically ventilated  HEAD: Normocephalic, atraumatic.  EYES: Pupils equal, round,  sluggishly reactive to light. Unable to assess extraocular muscles given mental status/medical condition. + scleral icterus.  MOUTH: Dry oral mucosa. Dentition intact. No abscess noted. ET/OG tube in place.  EAR, NOSE, THROAT: Clear without exudates. No external lesions.  NECK: Supple. No thyromegaly. No nodules. No JVD.  PULMONARY:  coarse breath sounds left greater than right without wheeze rales or rhonchi. No use of accessory muscles,poor respiratory effort. good air entry bilaterally CHEST: Nontender to palpation.  CARDIOVASCULAR: S1 and S2. Regular rate and rhythm. No murmurs, rubs, or gallops. No edema. Pedal pulses 2+ bilaterally.  GASTROINTESTINAL: Soft, nontender, nondistended. No masses. Positive bowel sounds. No hepatosplenomegaly.  MUSCULOSKELETAL: No swelling, clubbing, +1-2 edema b/l NEUROLOGIC: Unable to assess given mental status/medical condition SKIN: No ulceration, lesions, rashes, or cyanosis. Skin warm and dry. Turgor intact.   PSYCHIATRIC: Unable to assess given mental status/medical condition   LABORATORY PANEL:   CBC  Recent Labs Lab 02/07/16 0449  WBC 25.7*  HGB 8.8*  HCT 26.7*  PLT 20*   ------------------------------------------------------------------------------------------------------------------  Chemistries   Recent Labs Lab 02/07/16 0305 02/07/16 0449  NA 128* 128*  K 4.2 4.3  CL 96* 96*  CO2 21* 21*  GLUCOSE 242* 251*  BUN 20 21*  CREATININE 2.01* 1.85*  CALCIUM 7.4* 7.1*  MG 1.7  --   AST  --  2466*  ALT  --  577*  ALKPHOS  --  205*  BILITOT  --  5.7*   ------------------------------------------------------------------------------------------------------------------  Cardiac Enzymes  Recent Labs Lab 02/04/16 2018  TROPONINI 0.84*   ------------------------------------------------------------------------------------------------------------------  RADIOLOGY:  Dg Chest Port 1 View  02/06/2016  CLINICAL DATA:  Respiratory failure EXAM: PORTABLE CHEST 1 VIEW COMPARISON:  Yesterday FINDINGS: Endotracheal and orogastric tubes remain in good position. Stable right subclavian catheter, tip at the SVC level. Bilateral airspace disease is borderline improved. The opacity correlates with history of ARDS. No cardiogenic edema, effusion, or pneumothorax. IMPRESSION: 1. Stable positioning of tubes and central line. 2. Borderline improvement in aeration since yesterday. Bilateral airspace disease correlating with history of ARDS. Electronically Signed   By: Marnee SpringJonathon  Watts M.D.   On: 02/06/2016 03:15    ASSESSMENT AND PLAN:   65 -year-old Caucasian gentleman admitted 02/15/2016 with apparent alcoholic pancreatitis, to get him by acute kidney injury, hyperkalemia. It appears that he is under resisted from volume standpoint  1. Post cardiac arrest: likely Related to hyperkalemia patient was not on off unit telemetry  - potassium has improved.  No  arrhythmia.  Echo showing normal EF of  50-55%.   2. Acute respiratory failure hypoxemic requiring mechanical ventilation: This is probably due to underlying ARDS post cardiac arrest and underlying acute pancreatitis. -Continue full vent support, patient currently on 75 % FiO2 with 13 of PEEP. -Pulmonary following and defer vent management to them.    3. Acute kidney injury/ATN: With metabolic acidosis. Status post initiation of CRRT. Remains anuric. About 12 L + since admission.  -Remains hemodynamically unstable on 2 vasopressors and will continue CRRT and further care as per nephrology.  Cont. Fluid removal as per nephrology.   4. Alcoholic pancreatitis:  -US abdomen shows gallstones and sludge. S/o cholecystitis -empiric on Zosyn - cont. Supportive care w/ IV fluids, vasopressors (levophed,epinephrine and vasopressin)  5. Anemia/thrombocytopenia-due to underlying DIC/sepsis -Continue supportive care and follow counts. Transfuse as needed. Hold anticoagulants.  6. Shock-hypovolemic/septic. -Continue IV fluids, vasopressors, broad-spectrum IV antibiotics with Zosyn.  7. Hypokalemia - correct w/ CRRT as per Nephrology and will monitor.   Pt. Remains critically ill with multi-organ failure and high risk for Cardioresp. Death.  All the records are reviewed and case discussed with Care Management/Social Workerr. Management plans discussed with the patient, family and they are in agreement.  CODE STATUS:DNR  TOTAL Critical Care TIME TAKING CARE OF THIS PATIENT: 35 min.    Errik Mitchelle M.D on 02/07/2016 at 7:27 AM  Between 7am to 6pm - Pager - [phone]  After 6pm: House Pager: - [phone]  [facility]  Office  [phone]  CC: Primary care physician; Clydie Braun, MD

## 2016-02-07 NOTE — Progress Notes (Signed)
PARENTERAL NUTRITION CONSULT NOTE - INITIAL  Pharmacy Consult for TPN Electrolyte and Glucose Monitoring  No Known Allergies  Patient Measurements: Height: 5\' 7"  (170.2 cm) Weight: 211 lb 10.3 oz (96 kg) IBW/kg (Calculated) : 66.1  Vital Signs: Temp: 98.8 F (37.1 C) (03/18 0400) Pulse Rate: 72 (03/18 0400) Intake/Output from previous day: 03/17 0701 - 03/18 0700 In: 2389.6 [I.V.:1945.6; IV Piggyback:390; TPN:54] Out: 2339  Intake/Output from this shift: Total I/O In: 568.2 [I.V.:138.2; IV Piggyback:390; TPN:40] Out: 1373 [Other:1373]  Labs:  Recent Labs  02/04/16 0905 02/04/16 2018 02/05/16 0237 02/05/16 1049 02/06/16 0333  WBC 12.5* 9.5 7.4  --  14.2*  HGB 9.9* 8.7* 8.3*  --  7.3*  HCT 30.8* 27.8* 26.0*  --  23.0*  PLT 80* 54* 45* 31* 26*  28*  APTT 48*  --   --  88* 59*  INR 1.70  --   --  5.61* 5.48*     Recent Labs  02/04/16 0905  02/05/16 0237 02/05/16 0238 02/05/16 0644  02/06/16 0333  02/06/16 1942 02/06/16 2320 02/07/16 0305  NA 130*  < > 129*  --  132*  132*  < > 124*  124*  < > 129* 129* 128*  K 6.7*  < > 4.7  --  4.6  4.6  < > 3.6  3.6  < > 4.3 4.2 4.2  CL 102  < > 96*  --  97*  97*  < > 87*  88*  < > 95* 95* 96*  CO2 11*  < > 11*  --  11*  12*  < > 17*  16*  < > 21* 20* 21*  GLUCOSE 80  < > 107*  --  106*  105*  < > 185*  168*  < > 180* 219* 242*  BUN 66*  < > 42*  --  39*  39*  < > 24*  26*  < > 20 20 20   CREATININE 5.78*  < > 3.94*  --  3.78*  3.77*  < > 2.70*  2.79*  < > 2.07* 2.07* 2.01*  CALCIUM 6.5*  < > 6.7*  --  6.6*  6.7*  < > 5.9*  5.9*  < > 6.7* 7.0* 7.4*  MG  --   < > 1.4*  --  1.3*  < > 1.7  < > 1.8 1.7 1.7  PHOS  --   < >  --  7.8* 7.6*  --  4.7*  --   --   --   --   PROT 6.0*  --  5.7*  --   --   --  4.9*  --   --   --   --   ALBUMIN 2.3*  < > 3.0*  --  3.1*  --  2.7*  --   --   --   --   AST 49*  --  466*  --   --   --  2208*  --   --   --   --   ALT 21  --  79*  --   --   --  305*  --   --   --   --    ALKPHOS 119  --  105  --   --   --  130*  --   --   --   --   BILITOT 1.8*  --  2.2*  --   --   --  2.5*  --   --   --   --   < > =  values in this interval not displayed. Estimated Creatinine Clearance: 39.9 mL/min (by C-G formula based on Cr of 2.01).    Recent Labs  02/06/16 2009 02/07/16 0018 02/07/16 0351  GLUCAP 137* 196* 219*    Medical History: Past Medical History  Diagnosis Date  . Hypertension   . Diabetes mellitus without complication (HCC)   . 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 alteplase  2 mg Intracatheter Once  . alteplase  2 mg Intracatheter Once  . antiseptic oral rinse  7 mL Mouth Rinse Q2H  . calcium gluconate  2 g Intravenous Once  . chlorhexidine gluconate  15 mL Mouth Rinse BID  . dextrose  25 mL Intravenous Once  . hydrocortisone sod succinate (SOLU-CORTEF) inj  50 mg Intravenous Q6H  . insulin aspart  0-15 Units Subcutaneous 6 times per day  . insulin glargine  10 Units Subcutaneous Daily  . pantoprazole (PROTONIX) IV  40 mg Intravenous Daily  . piperacillin-tazobactam (ZOSYN)  IV  3.375 g Intravenous 3 times per day  . sodium chloride flush  10-40 mL Intracatheter Q12H  . thiamine IV  100 mg Intravenous Daily   Infusions:  . Marland KitchenTPN (CLINIMIX-E) Adult 40 mL/hr at 02/06/16 1839  . epinephrine 15 mcg/min (02/07/16 0337)  . fentaNYL infusion INTRAVENOUS 50 mcg/hr (02/06/16 1900)  . norepinephrine (LEVOPHED) Adult infusion 50 mcg/min (02/06/16 2337)  . pureflow 2,000 mL/hr at 02/07/16 0431  . vasopressin (PITRESSIN) infusion - *FOR SHOCK* 0.03 Units/min (02/06/16 0600)    Insulin Requirements in the past 24 hours:    Current Nutrition:    Assessment: 65 y/o M admitted with severe pancreatitis and renal failure on CRRT with orders to begin TPN.   K= 4.3, Mag= 1.9, Phos= 4.7, Ca= 6.7, albumin= 2.7, CCa= 7.7  3/18 @ 1:00 :   K = 4.2, Mag = 1.7, Ca = 7.0, albumin = 2.7, CCa = 8.0   Plan:   Patient is ordered SSI and Lantus 10 units daily. K and Mg are WNL. Corrected calcium has improved some. Will give two more grams of calcium gluconate. Follow up with ionized ca. Follow up with labs at next check  3/18:   CCa = 8.0,  Will order Ca gluconate 2 gm IV X 1 and recheck electrolytes on 3/18 with AM labs.  3/18 @ 3:30 :   K = 4.2                       Mag = 1.7                         Ca = 7.4 ,  Albumin = 2.7, CCa = 8.44   Will order Calcium gluconate 2 gm IV X 1 .   Mikaela Hilgeman D Clinical Pharmacist 02/07/2016,5:32 AM

## 2016-02-07 NOTE — Progress Notes (Signed)
PULMONARY / CRITICAL CARE MEDICINE   Name: Jay Mejia MRN: [id number] DOB: 15-Feb-1951    ADMISSION DATE:  02/01/2016 CONSULTATION DATE:  03/15  REFERRING MD: Estanislado Spire  PT PROFILE:   36 M admitted 03/14 with pancreatitis, AKI, metabolic acidosis, hyperkalemia. Suffered asystolic cardiac arrest and underwent 4-5 mins of CPR including chest compressions and HCO3 with restoration of spontaneous circulation. He was intubated in course of ACLS. On arrival to ICU, he was able to interact with me and F/C but shortly thereafter developed refractory shock and hypoxemia.   MAJOR EVENTS/TEST RESULTS: 03/14 Abd [place]: Gallstones and sludge in the gallbladder. There is gallbladder wall thickening. The sonographer does not report a positive sonographic Murphy sign. The findings are equivocal for cholecystitis. Diffuse increased echotexture of the liver, nonspecific but can be seen in fatty infiltration of liver. There is a nodular contour for of the liver surface suggesting changes of cirrhosis  03/15 CT head: NAICP 03/15 TTE: LVEF 50-55% 03/15 refractory shock despite DA, NE, VP and epinephrine infusions 03/15 CRRT initiated 03/15 refractory hypoxemia despite PEEP 18-20 cm H2O and FiO2 100% 03/16 Severe hypoxemia, refractory shock, severe acidosis, anuric renal failure persist 03/17 Improving gas exchange. Able to F/C on WUA. Remains on max vasopressors. Remains on CRRT with anuria. Acidosis improving. HCO3 gtt stopped. Severe abdominal distention - TPN initiated  INDWELLING DEVICES:: ETT 03/15 >>  L femoral HD cath 03/15 >>  R [place] CVL 05/15 >>  R femoral A-line 03/15 >>   MICRO DATA: MRSA PCR 03/15 >> NEG Resp 03/15 >> mod GPC chains >>  Blood 03/15 >>   ANTIMICROBIALS:  Pip-tazo 03/15 >>    SUBJECTIVE:  Doing well this morning, still with low plts, wife updated at bedside. Nephro and IM saw pt this am. Pt more awake with agitation, not purposeful movements.  Pressors being weaned.    VITAL SIGNS: BP 74/46 mmHg  Pulse 71  Temp(Src) 98.1 F (36.7 C) (Rectal)  Resp 35  Ht  (1.702 m)  Wt 211 lb 10.3 oz (96 kg)  BMI 33.14 kg/m2  SpO2 99%  HEMODYNAMICS:    VENTILATOR SETTINGS: Vent Mode:  [-] PRVC FiO2 (%):  [75 %] 75 % Set Rate:  [35 bmp] 35 bmp Vt Set:  [400 mL] 400 mL PEEP:  [16 cmH20] 16 cmH20  INTAKE / OUTPUT: I/O last 3 completed shifts: In: 7917.1 [I.V.:7373.1; IV Piggyback:490] Out: 4312 [Urine:5; Other:4307]  PHYSICAL EXAMINATION: General: sedated, intubated, intermittent periods of agitation  Neuro: PERRL, MAEs HEENT: NCAT Cardiovascular: distant HS, regular, no M noted Lungs: diffuse bilateral dependent crackles Abdomen: distended, BS absent, no rebound Ext: warm, absent distal pulses Skin: multiple telangectasias  LABS:  BMET  Recent Labs Lab 02/06/16 2320 02/07/16 0305 02/07/16 0449  NA 129* 128* 128*  K 4.2 4.2 4.3  CL 95* 96* 96*  CO2 20* 21* 21*  BUN 20 20 21*  CREATININE 2.07* 2.01* 1.85*  GLUCOSE 219* 242* 251*    Electrolytes  Recent Labs Lab 02/05/16 0644  02/06/16 0333  02/06/16 1942 02/06/16 2320 02/07/16 0305 02/07/16 0449  CALCIUM 6.6*  6.7*  < > 5.9*  5.9*  < > 6.7* 7.0* 7.4* 7.1*  MG 1.3*  < > 1.7  < > 1.8 1.7 1.7  --   PHOS 7.6*  --  4.7*  --   --   --   --  2.6  < > = values in this interval not displayed.  CBC  Recent  Labs Lab 02/05/16 0237 02/05/16 1049 02/06/16 0333 02/07/16 0449  WBC 7.4  --  14.2* 25.7*  HGB 8.3*  --  7.3* 8.8*  HCT 26.0*  --  23.0* 26.7*  PLT 45* 31* 26*  28* 20*    Coag's  Recent Labs Lab 02/04/16 0905 02/05/16 1049 02/06/16 0333  APTT 48* 88* 59*  INR 1.70 5.61* 5.48*    Sepsis Markers  Recent Labs Lab 02/04/16 1044 02/04/16 1250 02/05/16 1049 02/06/16 0333 02/07/16 0449  LATICACIDVEN 6.6* 7.8*  --   --   --   PROCALCITON  --   --  0.96 1.82 2.58    ABG  Recent Labs Lab 02/04/16 1400 02/05/16 1000 02/06/16 0450  PHART 6.97*  6.96* 7.18*  PCO2ART 49* 49* 50*  PO2ART 69* 60* 155*    Liver Enzymes  Recent Labs Lab 02/05/16 0237 02/05/16 0644 02/06/16 0333 02/07/16 0449  AST 466*  --  2208* 2466*  ALT 79*  --  305* 577*  ALKPHOS 105  --  130* 205*  BILITOT 2.2*  --  2.5* 5.7*  ALBUMIN 3.0* 3.1* 2.7* 2.4*    Cardiac Enzymes  Recent Labs Lab 02/04/16 0905 02/04/16 1447 02/04/16 2018  TROPONINI 0.10* 0.37* 0.84*    Glucose  Recent Labs Lab 02/06/16 1133 02/06/16 1643 02/06/16 2009 02/07/16 0018 02/07/16 0351 02/07/16 0729  GLUCAP 143* 108* 137* 196* 219* 222*    CXR: slightly improved edema /ARDS pattern   DISCUSSION: Principal Problem:   Pancreatitis, acute Active Problems:   Acute renal failure (HCC)   Hyperkalemia   Lactic acidosis   Acute respiratory failure with hypoxia (HCC)   ARDS (adult respiratory distress syndrome) (HCC)   Refractory shock (HCC)  Remains very critically ill with poor prognosis but has demonstrated some evidence of improvement since yesterday, especially with weaning of pressors (mainly epi) and improved neurologic status.   ASSESSMENT / PLAN:  PULMONARY A: Acute resp failure with hypoxia ARDS Severe hypoxemia - improving P:   Cont full vent support - settings reviewed and/or adjusted Cont vent bundle Daily SBT if/when meets criteria Cont ARDS strategy - PEEP decreased to 16 cm H2O Start weaning Fio2  CARDIOVASCULAR A:  Refractory shock - likely septic/SIRS P:  MAP goal > 65 mmHg Cont vasopressors including NE, epi, VP - wean NE first, then epi, then VP Cont empiric hydrocortisone for possible relative adrenal insufficiency  RENAL A:   AKI Lactic acidosis, improving Hyperkalemia, resolved Hypokalemia Severe hypervolemia P:   Monitor BMET intermittently Monitor I/Os Correct electrolytes as indicated DC HCO3 gtt 03/17 CRRT per Renal service, try to achieve -50cc/hr if BP will permit  GASTROINTESTINAL A:   Acute pancreatitis,  likely alcoholic Possible cholecystitis Cirrhotic changes on abd US Severe abdominal distention with ileus - too unstable presently for CT abd/pelvis P:   SUP: IV PPI TPN initiated 03/17 If becomes more stable, would obtain CTAP lipase trend 143>112 Last drink per wife (3/11), in the window for DTs, will monitor closely, prn versed for sedation can also be used for DTs if needed  HEMATOLOGIC A:   Acute blood loss anemia Thrombocytopenia Severe DIC P:  DVT px: SCDs Monitor CBC intermittently Transfuse per usual guidelines -   - one unit PRBCs on 3/17 Avoid platelets and FFP in setting of DIC unless major bleeding F/U DIC panel, plt down to 20K this am, recheck CBC in the afternoon  INFECTIOUS A:   Severe sepsis/SIRS Pancreatitis Possible cholecystitis Mildly elevated PCT P:  Monitor temp, WBC count Micro and abx as above  ENDOCRINE A:   DM2 - marginal control Presumed relative adrenal insufficiency P:   SSI, mod scale q 4 hrs Cont empiric hydrocortisone Lantus initiated 03/17  NEUROLOGIC A:   Acute encephalopathy  ICU/vent associated discomfort P:   RASS goal: -2 PAD protocol   FAMILY: wife updated @ bedside, he has made some progress but remains very critically ill and prognosis remains poor. DNR is still appropriate  I have personally obtained a history, examined the patient, evaluated laboratory and imaging results, formulated the assessment and plan and placed orders.  CRITICAL CARE: The patient is critically ill with multiple organ systems failure and requires high complexity decision making for assessment and support, frequent evaluation and titration of therapies, application of advanced monitoring technologies and extensive interpretation of multiple databases.   Critical Care Time devoted to patient care services described in this note is 45 minutes.    Stephanie Acre, MD [facility] Pager [phone] (please enter  7-digits) On Call Pager - [phone] (please enter 7-digits)     02/07/2016, 8:17 AM

## 2016-02-07 NOTE — Progress Notes (Signed)
Notified elink of platelet count 20. No new orders given.

## 2016-02-07 NOTE — Progress Notes (Signed)
eLink Physician-Brief Progress Note Patient Name: Jay BalloonJanerik B Mejia DOB: Jan 04, 1951 MRN: [id number]   Date of Service  02/07/2016  HPI/Events of Note  Camera check on patient with respiratory failure. Remains intubated. Eyes closed. Saturation greater than 94% on FiO2 0.4. Respiratory rate 35.  eICU Interventions  Continue current plan of care in intensive care unit.     Intervention Category Major Interventions: Respiratory failure - evaluation and management  Lawanda CousinsJennings Annesha Delgreco 02/07/2016, 7:54 PM

## 2016-02-07 NOTE — Progress Notes (Signed)
Nutrition Follow-up    INTERVENTION:   PN: discussed TPN with MD Mungal this AM; recommend continuing TPN at current rate of 40 ml/hr recommend not increasing TPN at present due to volume status and goal of keeping pt net negative over next 24 hours, MD agrees; will reassess on follow-up  NUTRITION DIAGNOSIS:   Inadequate oral intake related to acute illness as evidenced by NPO status.  GOAL:   Provide needs based on ASPEN/SCCM guidelines  MONITOR:   Vent status, Labs, I & O's, Weight trends (Energy Intake)  REASON FOR ASSESSMENT:   Ventilator    ASSESSMENT:    Pt remains on full vent, on levophed, epinephrine and vasopressin, weaning as able to maintain MAP goal of >65, remains anuric, on CRRT with current UF of 210 ml/hr, IVF infusing at total of 180 ml/hr with goal to keep pt net negative   Diet Order:  .TPN (CLINIMIX-E) Adult .TPN (CLINIMIX-E) Adult  PN: 5%AA/20%Dextrose infusing at rate of 40 ml/hr via right subclavian CVC Skin:  Reviewed, no issues   Digestive System: abdomen remains very distended/taut, no BS   Recent Labs Lab 02/05/16 0644  02/06/16 0333  02/06/16 2320 02/07/16 0305 02/07/16 0449 02/07/16 0748  NA 132*  132*  < > 124*  124*  < > 129* 128* 128* 128*  K 4.6  4.6  < > 3.6  3.6  < > 4.2 4.2 4.3 4.4  CL 97*  97*  < > 87*  88*  < > 95* 96* 96* 98*  CO2 11*  12*  < > 17*  16*  < > 20* 21* 21* 19*  BUN 39*  39*  < > 24*  26*  < > 20 20 21* 20  CREATININE 3.78*  3.77*  < > 2.70*  2.79*  < > 2.07* 2.01* 1.85* 1.87*  CALCIUM 6.6*  6.7*  < > 5.9*  5.9*  < > 7.0* 7.4* 7.1* 7.1*  MG 1.3*  < > 1.7  < > 1.7 1.7  --  1.8  PHOS 7.6*  --  4.7*  --   --   --  2.6  --   GLUCOSE 106*  105*  < > 185*  168*  < > 219* 242* 251* 251*  < > = values in this interval not displayed.  Glucose Profile:  Recent Labs  02/07/16 0018 02/07/16 0351 02/07/16 0729  GLUCAP 196* 219* 222*   Lipase     Component Value Date/Time   LIPASE 112*  02/07/2016 0449   Meds: ss novolog, lantus, levophed, vasopressin, epinpehrine  Height:   Ht Readings from Last 1 Encounters:  01/31/2016 5\' 7"  (1.702 m)    Weight:   Wt Readings from Last 1 Encounters:  02/07/16 211 lb 10.3 oz (96 kg)    Filed Weights   02/05/16 0455 02/06/16 0500 02/07/16 0434  Weight: 185 lb 3 oz (84 kg) 191 lb 12.8 oz (87 kg) 211 lb 10.3 oz (96 kg)    BMI:  Body mass index is 33.14 kg/(m^2).  Estimated Nutritional Needs:   Kcal:  1794 kcals (Ve: 12.5, Tmax: 36.4)   Protein:  131-174 g (1.5-2.0 g/kg)   Fluid:  2175-2610 mL (25-30 ml/kg)   HIGH Care Level  Romelle Starcherate Harol Shabazz MS, RD, LDN [phone] Pager  [phone] Weekend/On-Call Pager

## 2016-02-08 LAB — GLUCOSE, CAPILLARY
Glucose-Capillary: 209 mg/dL — ABNORMAL HIGH (ref 65–99)
Glucose-Capillary: 218 mg/dL — ABNORMAL HIGH (ref 65–99)
Glucose-Capillary: 229 mg/dL — ABNORMAL HIGH (ref 65–99)
Glucose-Capillary: 220 mg/dL — ABNORMAL HIGH (ref 65–99)
Glucose-Capillary: 232 mg/dL — ABNORMAL HIGH (ref 65–99)

## 2016-02-08 LAB — MAGNESIUM
MAGNESIUM: 1.7 mg/dL (ref 1.7–2.4)
MAGNESIUM: 1.7 mg/dL (ref 1.7–2.4)
Magnesium: 1.7 mg/dL (ref 1.7–2.4)
Magnesium: 1.6 mg/dL — ABNORMAL LOW (ref 1.7–2.4)
Magnesium: 1.6 mg/dL — ABNORMAL LOW (ref 1.7–2.4)
Magnesium: 1.7 mg/dL (ref 1.7–2.4)

## 2016-02-08 LAB — BASIC METABOLIC PANEL
ANION GAP: 5 (ref 5–15)
Anion gap: 7 (ref 5–15)
BUN: 20 mg/dL (ref 6–20)
BUN: 23 mg/dL — AB (ref 6–20)
CALCIUM: 7.6 mg/dL — AB (ref 8.9–10.3)
CO2: 24 mmol/L (ref 22–32)
CO2: 25 mmol/L (ref 22–32)
Calcium: 7.4 mg/dL — ABNORMAL LOW (ref 8.9–10.3)
Chloride: 100 mmol/L — ABNORMAL LOW (ref 101–111)
Chloride: 100 mmol/L — ABNORMAL LOW (ref 101–111)
Creatinine, Ser: 1.4 mg/dL — ABNORMAL HIGH (ref 0.61–1.24)
Creatinine, Ser: 1.6 mg/dL — ABNORMAL HIGH (ref 0.61–1.24)
GFR calc Af Amer: 59 mL/min — ABNORMAL LOW (ref >60)
GFR, EST AFRICAN AMERICAN: 50 mL/min — AB (ref >60)
GFR, EST NON AFRICAN AMERICAN: 43 mL/min — AB (ref >60)
GFR, EST NON AFRICAN AMERICAN: 51 mL/min — AB (ref >60)
GLUCOSE: 253 mg/dL — AB (ref 65–99)
Glucose, Bld: 271 mg/dL — ABNORMAL HIGH (ref 65–99)
POTASSIUM: 4.1 mmol/L (ref 3.5–5.1)
Potassium: 4.1 mmol/L (ref 3.5–5.1)
SODIUM: 130 mmol/L — AB (ref 135–145)
Sodium: 131 mmol/L — ABNORMAL LOW (ref 135–145)

## 2016-02-08 LAB — CULTURE, RESPIRATORY

## 2016-02-08 LAB — CULTURE, RESPIRATORY W GRAM STAIN

## 2016-02-08 LAB — PHOSPHORUS: Phosphorus: 1.4 mg/dL — ABNORMAL LOW (ref 2.5–4.6)

## 2016-02-08 LAB — CALCIUM, IONIZED: Calcium, Ionized, Serum: 3.6 mg/dL — ABNORMAL LOW (ref 4.5–5.6)

## 2016-02-08 MED ORDER — FENTANYL 2500MCG IN NS 250ML (10MCG/ML) PREMIX INFUSION
25.0000 ug/h | INTRAVENOUS | Status: DC
  Administered 2016-02-09 (×2): 75 ug/h via INTRAVENOUS
  Filled 2016-02-08: qty 250

## 2016-02-08 MED ORDER — TRACE MINERALS CR-CU-MN-SE-ZN 10-1000-500-60 MCG/ML IV SOLN
INTRAVENOUS | Status: AC
  Administered 2016-02-08: 19:00:00 via INTRAVENOUS
  Filled 2016-02-08: qty 960

## 2016-02-08 MED ORDER — INSULIN GLARGINE 100 UNIT/ML ~~LOC~~ SOLN
15.0000 [IU] | Freq: Every day | SUBCUTANEOUS | Status: DC
  Administered 2016-02-08 – 2016-02-09 (×2): 15 [IU] via SUBCUTANEOUS
  Filled 2016-02-08 (×2): qty 0.15

## 2016-02-08 MED ORDER — SODIUM PHOSPHATE 3 MMOLE/ML IV SOLN
20.0000 mmol | Freq: Once | INTRAVENOUS | Status: AC
  Administered 2016-02-08: 20 mmol via INTRAVENOUS
  Filled 2016-02-08: qty 6.67

## 2016-02-08 MED ORDER — STERILE WATER FOR INJECTION IJ SOLN
INTRAMUSCULAR | Status: AC
  Administered 2016-02-08: 1 mL
  Filled 2016-02-08: qty 10

## 2016-02-08 NOTE — Progress Notes (Signed)
Trialysis catheter patent and infusing. CRRT running smoothly.

## 2016-02-08 NOTE — Progress Notes (Signed)
Nutrition Follow-up    INTERVENTION:   PN: pt remains net positive over past 24 hours, discussed TPN plan with MD Lateef; volume status remains a concern, MD goal is to keep pt net negative over the next 24 hours, decision made to continue TPN at current rate of 40 ml/hr, orders placed; will reassess ability to titrate TPN up on follow-up EN: recommend trial of trophic feedings as soon as medically feasible   NUTRITION DIAGNOSIS:   Inadequate oral intake related to acute illness as evidenced by NPO status. Being addressed via TPN  GOAL:   Provide needs based on ASPEN/SCCM guidelines   MONITOR:   Vent status, Labs, I & O's, Weight trends (Energy Intake)  REASON FOR ASSESSMENT:   Ventilator    ASSESSMENT:    Pt remains critically ill on vent, on fentanyl, vasopressin, levophed and epinephrine; remains on CRRT with current UF of 210 ml/hr, remains net positive 1.1 L in  past 24 hours   Diet Order:  .TPN (CLINIMIX-E) Adult .TPN (CLINIMIX-E) Adult   PN:5%AA/20%Dextrose  Infusing at 40 ml/hr via CVC right subclavian  Skin:  Reviewed, no issues    Recent Labs Lab 02/06/16 0333  02/07/16 0449  02/07/16 1831 02/07/16 2356 02/08/16 0400 02/08/16 0613  NA 124*  124*  < > 128*  < > 129* 131* 130*  --   K 3.6  3.6  < > 4.3  < > 4.1 4.1 4.1  --   CL 87*  88*  < > 96*  < > 100* 100* 100*  --   CO2 17*  16*  < > 21*  < > 20* 24 25  --   BUN 24*  26*  < > 21*  < > 21* 20 23*  --   CREATININE 2.70*  2.79*  < > 1.85*  < > 1.49* 1.40* 1.60*  --   CALCIUM 5.9*  5.9*  < > 7.1*  < > 7.5* 7.6* 7.4*  --   MG 1.7  < >  --   < > 1.7 1.7 1.7 1.7  PHOS 4.7*  --  2.6  --   --   --  1.4*  --   GLUCOSE 185*  168*  < > 251*  < > 243* 253* 271*  --   < > = values in this interval not displayed.  Glucose Profile:  Recent Labs  02/07/16 2350 02/08/16 0414 02/08/16 0707  GLUCAP 209* 209* 218*    Meds:  ss novolog, lantus, Pureflow solution for CRRT 4K-3Ca-1Mg ,  thiamine  Digestive System: abdomen distended, BS hypoactive, last BM 3/14, OG LIS draining bilious material  Height:   Ht Readings from Last 1 Encounters:  02/15/2016 5\' 7"  (1.702 m)    Weight:   Wt Readings from Last 1 Encounters:  02/08/16 211 lb 13.8 oz (96.1 kg)   Filed Weights   02/06/16 0500 02/07/16 0434 02/08/16 0500  Weight: 191 lb 12.8 oz (87 kg) 211 lb 10.3 oz (96 kg) 211 lb 13.8 oz (96.1 kg)     BMI:  Body mass index is 33.17 kg/(m^2).  Estimated Nutritional Needs:   Kcal:  1794 kcals (Ve: 12.5, Tmax: 36.4)   Protein:  131-174 g (1.5-2.0 g/kg)   Fluid:  2175-2610 mL (25-30 ml/kg)   HIGH Care Level  Romelle Starcherate Tyre Beaver MS, RD, LDN [phone] Pager  [phone] Weekend/On-Call Pager

## 2016-02-08 NOTE — Progress Notes (Addendum)
[facility] - [place] at [facility]   PATIENT NAME: Jay BloomJanerik Mejia    MR#:  [id number]  DATE OF BIRTH:  07/14/1951  SUBJECTIVE:   Remains critically ill with multiorgan failure. Off Dopamine now.  FiO2 down to 75% and PEEP to 16.  Remains Anuric.Has frank hematuria   Acidosis is improving. Remains anemic/thrombocytopenic. Patient is on IV fentanyl, vasopressin, epinephrine, levophed gtts On CRRT  REVIEW OF SYSTEMS:  Unable to obtain given medical condition/mental status  DRUG ALLERGIES:  No Known Allergies  VITALS:  Blood pressure 74/46, pulse 63, temperature 98.2 F (36.8 C), temperature source Rectal, resp. rate 35, height 5\' 7"  (1.702 m), weight 96.1 kg (211 lb 13.8 oz), SpO2 99 %.  PHYSICAL EXAMINATION:   VITAL SIGNS: Filed Vitals:   02/08/16 0400 02/08/16 0700  BP:    Pulse: 66 63  Temp: 98.1 F (36.7 C) 98.2 F (36.8 C)  Resp:      GENERAL:64 y.o.male critically ill, intubated and mechanically ventilated  HEAD: Normocephalic, atraumatic.  EYES: Pupils equal, round,  sluggishly reactive to light. Unable to assess extraocular muscles given mental status/medical condition. + scleral icterus.  MOUTH: Dry oral mucosa. Dentition intact. No abscess noted. ET/OG tube in place.  EAR, NOSE, THROAT: Clear without exudates. No external lesions.  NECK: Supple. No thyromegaly. No nodules. No JVD.  PULMONARY:  coarse breath sounds left greater than right without wheeze rales or rhonchi. No use of accessory muscles,poor respiratory effort. good air entry bilaterally CHEST: Nontender to palpation.  CARDIOVASCULAR: S1 and S2. Regular rate and rhythm. No murmurs, rubs, or gallops. No edema. Pedal pulses 2+ bilaterally.  GASTROINTESTINAL: Soft, nontender, nondistended. No masses. Positive bowel sounds. No hepatosplenomegaly. Foley catheter MUSCULOSKELETAL: No swelling, clubbing, +1-2 edema b/l NEUROLOGIC: Unable to assess given mental status/medical  condition SKIN: No ulceration, lesions, rashes, or cyanosis. Skin warm and dry. Turgor intact.  PSYCHIATRIC: Unable to assess given mental status/medical condition   LABORATORY PANEL:   CBC  Recent Labs Lab 02/07/16 0449 02/07/16 0532  WBC 25.7*  --   HGB 8.8*  --   HCT 26.7*  --   PLT 20* 19*   ------------------------------------------------------------------------------------------------------------------  Chemistries   Recent Labs Lab 02/07/16 0449  02/08/16 0400 02/08/16 0613  NA 128*  < > 130*  --   K 4.3  < > 4.1  --   CL 96*  < > 100*  --   CO2 21*  < > 25  --   GLUCOSE 251*  < > 271*  --   BUN 21*  < > 23*  --   CREATININE 1.85*  < > 1.60*  --   CALCIUM 7.1*  < > 7.4*  --   MG  --   < > 1.7 1.7  AST 2466*  --   --   --   ALT 577*  --   --   --   ALKPHOS 205*  --   --   --   BILITOT 5.7*  --   --   --   < > = values in this interval not displayed. ------------------------------------------------------------------------------------------------------------------  Cardiac Enzymes  Recent Labs Lab 02/04/16 2018  TROPONINI 0.84*   ------------------------------------------------------------------------------------------------------------------  RADIOLOGY:  Dg Chest Port 1 View  02/07/2016  CLINICAL DATA:  Respiratory failure EXAM: PORTABLE CHEST 1 VIEW COMPARISON:  Chest radiograph from one day prior. FINDINGS: Endotracheal tube tip is 5 cm above the carina. Enteric tube terminates in the gastric fundus. Right subclavian  central venous catheter terminates in the lower third of the superior vena cava. Stable cardiomediastinal silhouette with normal heart size. No pneumothorax. No pleural effusion. Patchy bibasilar lung opacities appear improved slightly. IMPRESSION: 1. Well-positioned support structures as described. 2. Slight improvement in patchy bibasilar lung opacities. Electronically Signed   By: Delbert Phenix M.D.   On: 02/07/2016 09:13    ASSESSMENT  AND PLAN:   65 -year-old Caucasian gentleman admitted 02/20/16 with apparent alcoholic pancreatitis, to get him by acute kidney injury, hyperkalemia. It appears that he is under resisted from volume standpoint  1. Post cardiac arrest: likely Related to hyperkalemia patient was not on off unit telemetry  - potassium has improved.  No arrhythmia.  Echo showing normal EF of 50-55%.   2. Acute respiratory failure hypoxemic requiring mechanical ventilation due to underlying ARDS post cardiac arrest and underlying acute pancreatitis. -Continue full vent support, patient currently on 50 % FiO2 with 13 of PEEP. -Pulmonary following and defer vent management to them.    3. Acute kidney injury/ATN: With metabolic acidosis. Status post initiation of CRRT. Remains anuric. About 12 L + since admission.  -Remains hemodynamically unstable on 2 vasopressors and will continue CRRT and further care as per nephrology.  Cont. Fluid removal as per nephrology.  -Creatinine slowly trending down  4. Alcoholic pancreatitis with severe shock liver -US abdomen shows gallstones and sludge. S/o cholecystitis -empiric on Zosyn - cont. Supportive care w/ IV fluids, vasopressors (levophed,epinephrine and vasopressin) -LFTs remain elevated including LDH and PT/INR  5. Anemia/thrombocytopenia-due to underlying DIC/sepsis -Continue supportive care and follow counts. Transfuse as needed. Hold anticoagulants.  6. Shock-hypovolemic/septic. -Continue IV fluids, vasopressors, broad-spectrum IV antibiotics with Zosyn.  7. Hypokalemia - correct w/ CRRT as per Nephrology and will monitor.   8. DVT prophylaxis SCD and teds.  Pt. Remains critically ill with multi-organ failure and high risk for Cardioresp. Death. Prognosis remains poor  CODE STATUS:DNR  TOTAL Critical Care TIME TAKING CARE OF THIS PATIENT: 35 min.    Zakaree Mcclenahan M.D on 02/08/2016 at 7:49 AM  Between 7am to 6pm - Pager - [phone]  After 6pm: House  Pager: - [phone]  [facility]  Office  [phone]  CC: Primary care physician; Clydie Braun, MD

## 2016-02-08 NOTE — Progress Notes (Signed)
Subjective:  Critical illness persist at the moment. Patient remains on CRRT. Patient still net positive despite ultrafiltration rate of 210 cc per hour.    Objective:  Vital signs in last 24 hours:  Temp:  [97 F (36.1 C)-98.8 F (37.1 C)] 98.2 F (36.8 C) (03/19 0900) Pulse Rate:  [56-97] 71 (03/19 0900) Resp:  [22-35] 35 (03/18 1300) SpO2:  [93 %-100 %] 99 % (03/19 0900) Arterial Line BP: (73-135)/(42-74) 130/74 mmHg (03/19 0900) FiO2 (%):  [40 %-50 %] 40 % (03/19 0816) Weight:  [96.1 kg (211 lb 13.8 oz)] 96.1 kg (211 lb 13.8 oz) (03/19 0500)  Weight change: 0.1 kg (3.5 oz) Filed Weights   02/06/16 0500 02/07/16 0434 02/08/16 0500  Weight: 87 kg (191 lb 12.8 oz) 96 kg (211 lb 10.3 oz) 96.1 kg (211 lb 13.8 oz)    Intake/Output:    Intake/Output Summary (Last 24 hours) at 02/08/16 1038 Last data filed at 02/08/16 0600  Gross per 24 hour  Intake   5224 ml  Output   3482 ml  Net   1742 ml     Physical Exam: General: Critically ill appearing  HEENT Eyes closed, ETT  Neck supple  Pulm/lungs Coarse crackles b/l, Vent assisted  CVS/Heart S1S2 no rubs  Abdomen:  Soft, non distended, scant BS  Extremities: 3+ b/l LE edema  Neurologic: Sedated, not following commands  Skin: No acute rashes  Access: Right femoral dialysis catheter       Basic Metabolic Panel:   Recent Labs Lab 02/05/16 0238 02/05/16 0644  02/06/16 0333  02/07/16 0449  02/07/16 0947 02/07/16 1559 02/07/16 1831 02/07/16 2356 02/08/16 0400 02/08/16 0613  NA  --  132*  132*  < > 124*  124*  < > 128*  < > 128* 129* 129* 131* 130*  --   K  --  4.6  4.6  < > 3.6  3.6  < > 4.3  < > 4.2 4.3 4.1 4.1 4.1  --   CL  --  97*  97*  < > 87*  88*  < > 96*  < > 98* 99* 100* 100* 100*  --   CO2  --  11*  12*  < > 17*  16*  < > 21*  < > 20* 23 20* 24 25  --   GLUCOSE  --  106*  105*  < > 185*  168*  < > 251*  < > 247* 247* 243* 253* 271*  --   BUN  --  39*  39*  < > 24*  26*  < > 21*  < > 21*  21* 21* 20 23*  --   CREATININE  --  3.78*  3.77*  < > 2.70*  2.79*  < > 1.85*  < > 1.82* 1.57* 1.49* 1.40* 1.60*  --   CALCIUM  --  6.6*  6.7*  < > 5.9*  5.9*  < > 7.1*  < > 7.1* 7.6* 7.5* 7.6* 7.4*  --   MG  --  1.3*  < > 1.7  < >  --   < > 1.8 1.7 1.7 1.7 1.7 1.7  PHOS 7.8* 7.6*  --  4.7*  --  2.6  --   --   --   --   --  1.4*  --   < > = values in this interval not displayed.   CBC:  Recent Labs Lab 02/04/16 0905 02/04/16 2018 02/05/16 0237 02/05/16 1049  02/06/16 0333 02/07/16 0449 02/07/16 0532  WBC 12.5* 9.5 7.4  --  14.2* 25.7*  --   HGB 9.9* 8.7* 8.3*  --  7.3* 8.8*  --   HCT 30.8* 27.8* 26.0*  --  23.0* 26.7*  --   MCV 103.8* 109.6* 109.3*  --  105.6* 97.6  --   PLT 80* 54* 45* 31* 26*  28* 20* 19*      Microbiology:  Recent Results (from the past 720 hour(s))  MRSA PCR Screening     Status: None   Collection Time: 02/04/16  8:39 AM  Result Value Ref Range Status   MRSA by PCR NEGATIVE NEGATIVE Final    Comment:        The GeneXpert MRSA Assay (FDA approved for NASAL specimens only), is one component of a comprehensive MRSA colonization surveillance program. It is not intended to diagnose MRSA infection nor to guide or monitor treatment for MRSA infections.   Culture, blood (Routine X 2) w Reflex to ID Panel     Status: None (Preliminary result)   Collection Time: 02/04/16 12:34 PM  Result Value Ref Range Status   Specimen Description BLOOD RIGHT ARM  Final   Special Requests   Final    BOTTLES DRAWN AEROBIC AND ANAEROBIC AER ANA   Culture NO GROWTH 3 DAYS  Final   Report Status PENDING  Incomplete  Culture, blood (Routine X 2) w Reflex to ID Panel     Status: None (Preliminary result)   Collection Time: 02/04/16 12:49 PM  Result Value Ref Range Status   Specimen Description BLOOD RIGHT HAND  Final   Special Requests   Final    BOTTLES DRAWN AEROBIC AND ANAEROBIC AER ANA   Culture NO GROWTH 3 DAYS  Final   Report Status  PENDING  Incomplete  Culture, respiratory (NON-Expectorated)     Status: None   Collection Time: 02/04/16  2:50 PM  Result Value Ref Range Status   Specimen Description TRACHEAL ASPIRATE  Final   Special Requests NONE  Final   Gram Stain   Final    MODERATE GRAM POSITIVE COCCI IN CHAINS MODERATE RED BLOOD CELLS RARE WBC SEEN    Culture   Final    LIGHT GROWTH KLEBSIELLA PNEUMONIAE MODERATE GROWTH HAEMOPHILUS PARAINFLUENZAE BETA LACTAMASE POSITIVE    Report Status 02/08/2016 FINAL  Final   Organism ID, Bacteria KLEBSIELLA PNEUMONIAE  Final      Susceptibility   Klebsiella pneumoniae - MIC*    AMPICILLIN >=32 RESISTANT Resistant     CEFAZOLIN <=4 SENSITIVE Sensitive     CEFEPIME <=1 SENSITIVE Sensitive     CEFTAZIDIME <=1 SENSITIVE Sensitive     CEFTRIAXONE <=1 SENSITIVE Sensitive     CIPROFLOXACIN <=0.25 SENSITIVE Sensitive     GENTAMICIN <=1 SENSITIVE Sensitive     IMIPENEM <=0.25 SENSITIVE Sensitive     TRIMETH/SULFA <=20 SENSITIVE Sensitive     AMPICILLIN/SULBACTAM 4 SENSITIVE Sensitive     PIP/TAZO <=4 SENSITIVE Sensitive     Extended ESBL NEGATIVE Sensitive     * LIGHT GROWTH KLEBSIELLA PNEUMONIAE    Coagulation Studies:  Recent Labs  02/05/16 1049 02/06/16 0333 02/07/16 0532  LABPROT 49.0* 48.2* 30.0*  INR 5.61* 5.48* 2.92    Urinalysis: No results for input(s): COLORURINE, LABSPEC, PHURINE, GLUCOSEU, HGBUR, BILIRUBINUR, KETONESUR, PROTEINUR, UROBILINOGEN, NITRITE, LEUKOCYTESUR in the last 72 hours.  Invalid input(s): APPERANCEUR    Imaging: Dg Chest Port 1 View  02/07/2016  CLINICAL  DATA:  Respiratory failure EXAM: PORTABLE CHEST 1 VIEW COMPARISON:  Chest radiograph from one day prior. FINDINGS: Endotracheal tube tip is 5 cm above the carina. Enteric tube terminates in the gastric fundus. Right subclavian central venous catheter terminates in the lower third of the superior vena cava. Stable cardiomediastinal silhouette with normal heart size. No  pneumothorax. No pleural effusion. Patchy bibasilar lung opacities appear improved slightly. IMPRESSION: 1. Well-positioned support structures as described. 2. Slight improvement in patchy bibasilar lung opacities. Electronically Signed   By: Delbert PhenixJason A Poff M.D.   On: 02/07/2016 09:13     Medications:   . Marland Kitchen.TPN (CLINIMIX-E) Adult 40 mL/hr at 02/07/16 1900  . Marland Kitchen.TPN (CLINIMIX-E) Adult    . epinephrine 13 mcg/min (02/08/16 1017)  . fentaNYL infusion INTRAVENOUS 50 mcg/hr (02/07/16 1900)  . norepinephrine (LEVOPHED) Adult infusion Stopped (02/08/16 1017)  . pureflow 6,000 mL/hr at 02/07/16 1109  . vasopressin (PITRESSIN) infusion - *FOR SHOCK* 0.03 Units/min (02/07/16 1900)   . alteplase  2 mg Intracatheter Once  . antiseptic oral rinse  7 mL Mouth Rinse Q2H  . chlorhexidine gluconate  15 mL Mouth Rinse BID  . dextrose  25 mL Intravenous Once  . hydrocortisone sod succinate (SOLU-CORTEF) inj  50 mg Intravenous Q6H  . insulin aspart  0-15 Units Subcutaneous 6 times per day  . insulin glargine  15 Units Subcutaneous Daily  . pantoprazole (PROTONIX) IV  40 mg Intravenous Daily  . piperacillin-tazobactam (ZOSYN)  IV  3.375 g Intravenous 3 times per day  . sodium chloride flush  10-40 mL Intracatheter Q12H  . thiamine IV  100 mg Intravenous Daily   fentaNYL, heparin, midazolam, ondansetron (ZOFRAN) IV, sodium chloride flush  Assessment/ Plan:  65 y.o. male with a PMHX of Coronary disease, cirrhosis, Esophageal varices, portal hypertensive gastropathy, type 2 diabetes, hypertension, left carotid surgery. peripheral vascular disease, was admitted on 09-21-16 with abdominal pain/Acute pancreatitis.  3/15- AM - Code blue - transferred to ICU - CRRT started 3/16- Multiple pressors- CRRT contd. anuric 3/17- UF rate adjusted to 210cc/hr  1. Acute renal failure. (Baseline Cr 0.9) with multiple organ failure, due to ATN.  Likely secondary to severe ATN from Acute pancreatitis secondary to alcohol use   -Patient remained net positive yesterday by approximately 1 L. However input has been decreased today. Therefore we will continue current CRRT prescription with ultrafiltration rate of 210 cc per hour.  2. High anion gap acidosis Lactic acidosis  -Acidosis has improved. PH currently 7.38.  3. Hyperkalemia Resolved with CRRT, potassium currently 4.1. Continue to monitor.  4. Acute respiratory failure. Patient with ARDS. -FiO2 currently stable at 40%. Continue ventilatory support.  5.  Hyponatremia:  Sodium currently 130. Continue to monitor with CRRT support.       LOS: 5 Sargon Scouten 3/19/201710:38 AM

## 2016-02-08 NOTE — Progress Notes (Signed)
Dr. Dema SeverinMungal at bedside to round on patient.  Spoke with family.  Dr. Voncille LoMonsoor paged to inquire about UF rate.  Pt remains positive 1100Ml yesterday.  He declined to change UF at this time, will reevaluate later today.

## 2016-02-08 NOTE — Progress Notes (Signed)
CRRT cartridge changed. Trialysis catheter port used for IV administration occluded. Applied Alteplase 0.5 mL per directions. Will reassess for patency.

## 2016-02-08 NOTE — Progress Notes (Signed)
PULMONARY / CRITICAL CARE MEDICINE   Name: Jay Mejia MRN: [id number] DOB: May 14, 1951    ADMISSION DATE:  01/28/2016 CONSULTATION DATE:  03/15  REFERRING MD: Estanislado SpireHower, [facility]  PT PROFILE:   [age] M admitted 03/14 with pancreatitis, AKI, metabolic acidosis, hyperkalemia. Suffered asystolic cardiac arrest and underwent 4-5 mins of CPR including chest compressions and HCO3 with restoration of spontaneous circulation. He was intubated in course of ACLS. On arrival to ICU, he was able to interact with me and F/C but shortly thereafter developed refractory shock and hypoxemia.   MAJOR EVENTS/TEST RESULTS: 03/14 Abd US: Gallstones and sludge in the gallbladder. There is gallbladder wall thickening. The sonographer does not report a positive sonographic Murphy sign. The findings are equivocal for cholecystitis. Diffuse increased echotexture of the liver, nonspecific but can be seen in fatty infiltration of liver. There is a nodular contour for of the liver surface suggesting changes of cirrhosis  03/15 CT head: NAICP 03/15 TTE: LVEF 50-55% 03/15 refractory shock despite DA, NE, VP and epinephrine infusions 03/15 CRRT initiated 03/15 refractory hypoxemia despite PEEP 18-20 cm H2O and FiO2 100% 03/16 Severe hypoxemia, refractory shock, severe acidosis, anuric renal failure persist 03/17 Improving gas exchange. Able to F/C on WUA. Remains on max vasopressors. Remains on CRRT with anuria. Acidosis improving. HCO3 gtt stopped. Severe abdominal distention - TPN initiated  INDWELLING DEVICES:: ETT 03/15 >>  L femoral HD cath 03/15 >>  R [employer] CVL 05/15 >>  R femoral A-line 03/15 >>   MICRO DATA: MRSA PCR 03/15 >> NEG Resp 03/15 >> mod GPC chains >>  Blood 03/15 >>   ANTIMICROBIALS:  Pip-tazo 03/15 >>    SUBJECTIVE:  Doing well this morning, still with low plts, wife and daughter updated at bedside. Nephro and IM saw pt this am. Again with agitation, not purposeful movements.  Pressors being  weaned. Net positive 1L   VITAL SIGNS: BP 74/46 mmHg  Pulse 63  Temp(Src) 98.2 F (36.8 C) (Rectal)  Resp 35  Ht 5\' 7"  (1.702 m)  Wt 211 lb 13.8 oz (96.1 kg)  BMI 33.17 kg/m2  SpO2 99%  HEMODYNAMICS:    VENTILATOR SETTINGS: Vent Mode:  [-] PRVC FiO2 (%):  [40 %-50 %] 40 % Set Rate:  [35 bmp] 35 bmp Vt Set:  [400 mL] 400 mL PEEP:  [16 cmH20] 16 cmH20 Plateau Pressure:  [26 cmH20] 26 cmH20  INTAKE / OUTPUT: I/O last 3 completed shifts: In: 7123.4 [P.O.:1800; I.V.:3383.4; IV Piggyback:540] Out: 6272 [Other:6272]  PHYSICAL EXAMINATION: General: sedated, intubated, intermittent periods of agitation  Neuro: PERRL, MAEs HEENT: NCAT Cardiovascular: distant HS, regular, no M noted Lungs: diffuse bilateral dependent crackles Abdomen: distended, BS absent, no rebound Ext: warm, absent distal pulses Skin: multiple telangectasias  LABS:  BMET  Recent Labs Lab 02/07/16 1831 02/07/16 2356 02/08/16 0400  NA 129* 131* 130*  K 4.1 4.1 4.1  CL 100* 100* 100*  CO2 20* 24 25  BUN 21* 20 23*  CREATININE 1.49* 1.40* 1.60*  GLUCOSE 243* 253* 271*    Electrolytes  Recent Labs Lab 02/06/16 0333  02/07/16 0449  02/07/16 1831 02/07/16 2356 02/08/16 0400 02/08/16 0613  CALCIUM 5.9*  5.9*  < > 7.1*  < > 7.5* 7.6* 7.4*  --   MG 1.7  < >  --   < > 1.7 1.7 1.7 1.7  PHOS 4.7*  --  2.6  --   --   --  1.4*  --   < > =  values in this interval not displayed.  CBC  Recent Labs Lab 02/05/16 0237  02/06/16 0333 02/07/16 0449 02/07/16 0532  WBC 7.4  --  14.2* 25.7*  --   HGB 8.3*  --  7.3* 8.8*  --   HCT 26.0*  --  23.0* 26.7*  --   PLT 45*  < > 26*  28* 20* 19*  < > = values in this interval not displayed.  Coag's  Recent Labs Lab 02/05/16 1049 02/06/16 0333 02/07/16 0532  APTT 88* 59* 49*  INR 5.61* 5.48* 2.92    Sepsis Markers  Recent Labs Lab 02/04/16 1044 02/04/16 1250 02/05/16 1049 02/06/16 0333 02/07/16 0449  LATICACIDVEN 6.6* 7.8*  --   --    --   PROCALCITON  --   --  0.96 1.82 2.58    ABG  Recent Labs Lab 02/04/16 1400 02/05/16 1000 02/06/16 0450  PHART 6.97* 6.96* 7.18*  PCO2ART 49* 49* 50*  PO2ART 69* 60* 155*    Liver Enzymes  Recent Labs Lab 02/05/16 0237 02/05/16 0644 02/06/16 0333 02/07/16 0449  AST 466*  --  2208* 2466*  ALT 79*  --  305* 577*  ALKPHOS 105  --  130* 205*  BILITOT 2.2*  --  2.5* 5.7*  ALBUMIN 3.0* 3.1* 2.7* 2.4*    Cardiac Enzymes  Recent Labs Lab 02/04/16 0905 02/04/16 1447 02/04/16 2018  TROPONINI 0.10* 0.37* 0.84*    Glucose  Recent Labs Lab 02/07/16 1115 02/07/16 1557 02/07/16 2048 02/07/16 2350 02/08/16 0414 02/08/16 0707  GLUCAP 226* 226* 222* 209* 209* 218*    CXR: slightly improved edema /ARDS pattern   DISCUSSION: Principal Problem:   Pancreatitis, acute Active Problems:   Acute renal failure (HCC)   Hyperkalemia   Lactic acidosis   Acute respiratory failure with hypoxia (HCC)   ARDS (adult respiratory distress syndrome) (HCC)   Refractory shock (HCC)  Remains very critically ill with poor prognosis but has demonstrated some evidence of improvement since yesterday, especially with weaning of pressors (mainly epi) and improved neurologic status.   ASSESSMENT / PLAN:  PULMONARY A: Acute resp failure with hypoxia ARDS Severe hypoxemia - improving P:   Cont full vent support - settings reviewed and/or adjusted Cont vent bundle Daily SBT if/when meets criteria Cont ARDS strategy - PEEP decreased to 12 cm H2O Start weaning Fio2  CARDIOVASCULAR A:  Refractory shock - likely septic/SIRS P:  MAP goal > 65 mmHg Cont vasopressors including NE, epi, VP - wean NE first, then epi, then VP Cont empiric hydrocortisone for possible relative adrenal insufficiency  RENAL A:   AKI Lactic acidosis, improving Hyperkalemia, resolved Hypokalemia Severe hypervolemia Hyponatremia Metabolic Acidosis - lactic - improving.  P:   Monitor BMET  intermittently Monitor I/Os Correct electrolytes as indicated DC HCO3 gtt 03/17 CRRT per Renal service, try to achieve -50cc/hr if BP will permit.  Net positive 1L, cont with UF@210  per renal recs  GASTROINTESTINAL A:   Acute pancreatitis, likely alcoholic Possible cholecystitis Cirrhotic changes on abd [place] Severe abdominal distention with ileus - too unstable presently for CT abd/pelvis P:   SUP: IV PPI TPN initiated 03/17 If becomes more stable, would obtain CTAP lipase trend 143>112 Last drink per wife (3/11), in the window for DTs, will monitor closely, prn versed for sedation can also be used for DTs if needed  HEMATOLOGIC A:   Acute blood loss anemia Thrombocytopenia Severe DIC P:  DVT px: SCDs Monitor CBC intermittently Transfuse per usual  guidelines -   - one unit PRBCs on 3/17 Avoid platelets and FFP in setting of DIC unless major bleeding F/U DIC panel, plt down to 19K this am. Fibrinogen 60>79, mild improvement, however, is may need to consider fibrinogen replacement if DIC worsens.  INFECTIOUS A:   Severe sepsis/SIRS Pancreatitis Possible cholecystitis Mildly elevated PCT P:   Monitor temp, WBC count Micro and abx as above  ENDOCRINE A:   DM2 - marginal control Presumed relative adrenal insufficiency P:   SSI, mod scale q 4 hrs Cont empiric hydrocortisone Lantus initiated 03/17 - adjusted based on requirements.   NEUROLOGIC A:   Acute encephalopathy  ICU/vent associated discomfort P:   RASS goal: -2 PAD protocol   FAMILY: wife updated and daughter @ bedside, he has made some progress but remains very critically ill and prognosis remains poor. DNR is still appropriate  I have personally obtained a history, examined the patient, evaluated laboratory and imaging results, formulated the assessment and plan and placed orders.  CRITICAL CARE: The patient is critically ill with multiple organ systems failure and requires high complexity decision  making for assessment and support, frequent evaluation and titration of therapies, application of advanced monitoring technologies and extensive interpretation of multiple databases.   Critical Care Time devoted to patient care services described in this note is 45 minutes.    Stephanie Acre, MD [facility] Pager [phone] (please enter 7-digits) On Call Pager - [phone] (please enter 7-digits)     02/08/2016, 8:29 AM

## 2016-02-09 ENCOUNTER — Inpatient Hospital Stay: Payer: Medicare Other

## 2016-02-09 LAB — CBC WITH DIFFERENTIAL/PLATELET
BLASTS: 0 %
Band Neutrophils: 2 %
Basophils Absolute: 0 10*3/uL (ref 0–0.1)
Basophils Relative: 0 %
Eosinophils Absolute: 0 10*3/uL (ref 0–0.7)
Eosinophils Relative: 0 %
HEMATOCRIT: 28 % — AB (ref 40.0–52.0)
HEMOGLOBIN: 9.5 g/dL — AB (ref 13.0–18.0)
LYMPHS PCT: 5 %
Lymphs Abs: 1 10*3/uL (ref 1.0–3.6)
MCH: 32.9 pg (ref 26.0–34.0)
MCHC: 34.1 g/dL (ref 32.0–36.0)
MCV: 96.7 fL (ref 80.0–100.0)
MONOS PCT: 6 %
Metamyelocytes Relative: 0 %
Monocytes Absolute: 1.2 10*3/uL — ABNORMAL HIGH (ref 0.2–1.0)
Myelocytes: 1 %
NEUTROS ABS: 17.7 10*3/uL — AB (ref 1.4–6.5)
NEUTROS PCT: 86 %
OTHER: 0 %
Platelets: 14 10*3/uL — CL (ref 150–440)
Promyelocytes Absolute: 0 %
RBC: 2.89 MIL/uL — AB (ref 4.40–5.90)
RDW: 19 % — AB (ref 11.5–14.5)
WBC: 19.9 10*3/uL — AB (ref 3.8–10.6)
nRBC: 7 /100 WBC — ABNORMAL HIGH

## 2016-02-09 LAB — BLOOD GAS, ARTERIAL
ACID-BASE DEFICIT: 1.8 mmol/L (ref 0.0–2.0)
Bicarbonate: 23.1 mEq/L (ref 21.0–28.0)
FIO2: 40
LHR: 35 {breaths}/min
O2 Saturation: 91.6 %
PEEP: 12 cmH2O
Patient temperature: 37
VT: 400 mL
pCO2 arterial: 39 mmHg (ref 32.0–48.0)
pH, Arterial: 7.38 (ref 7.350–7.450)
pO2, Arterial: 64 mmHg — ABNORMAL LOW (ref 83.0–108.0)

## 2016-02-09 LAB — GLUCOSE, CAPILLARY
GLUCOSE-CAPILLARY: 213 mg/dL — AB (ref 65–99)
GLUCOSE-CAPILLARY: 228 mg/dL — AB (ref 65–99)
GLUCOSE-CAPILLARY: 238 mg/dL — AB (ref 65–99)
Glucose-Capillary: 253 mg/dL — ABNORMAL HIGH (ref 65–99)
Glucose-Capillary: 273 mg/dL — ABNORMAL HIGH (ref 65–99)
Glucose-Capillary: 294 mg/dL — ABNORMAL HIGH (ref 65–99)

## 2016-02-09 LAB — CULTURE, BLOOD (ROUTINE X 2)
Culture: NO GROWTH
Culture: NO GROWTH

## 2016-02-09 LAB — COMPREHENSIVE METABOLIC PANEL
ALK PHOS: 316 U/L — AB (ref 38–126)
ALT: 318 U/L — ABNORMAL HIGH (ref 17–63)
ANION GAP: 4 — AB (ref 5–15)
AST: 443 U/L — AB (ref 15–41)
Albumin: 2.2 g/dL — ABNORMAL LOW (ref 3.5–5.0)
BILIRUBIN TOTAL: 9.3 mg/dL — AB (ref 0.3–1.2)
BUN: 26 mg/dL — AB (ref 6–20)
CALCIUM: 7.7 mg/dL — AB (ref 8.9–10.3)
CO2: 26 mmol/L (ref 22–32)
Chloride: 102 mmol/L (ref 101–111)
Creatinine, Ser: 1.27 mg/dL — ABNORMAL HIGH (ref 0.61–1.24)
GFR calc Af Amer: >60 mL/min (ref >60)
GFR, EST NON AFRICAN AMERICAN: 57 mL/min — AB (ref >60)
Glucose, Bld: 269 mg/dL — ABNORMAL HIGH (ref 65–99)
POTASSIUM: 4 mmol/L (ref 3.5–5.1)
Sodium: 132 mmol/L — ABNORMAL LOW (ref 135–145)
TOTAL PROTEIN: 5.3 g/dL — AB (ref 6.5–8.1)

## 2016-02-09 LAB — AMMONIA: Ammonia: 37 umol/L — ABNORMAL HIGH (ref 9–35)

## 2016-02-09 LAB — MAGNESIUM
MAGNESIUM: 1.6 mg/dL — AB (ref 1.7–2.4)
MAGNESIUM: 1.7 mg/dL (ref 1.7–2.4)
MAGNESIUM: 1.7 mg/dL (ref 1.7–2.4)
Magnesium: 1.6 mg/dL — ABNORMAL LOW (ref 1.7–2.4)
Magnesium: 1.7 mg/dL (ref 1.7–2.4)
Magnesium: 1.6 mg/dL — ABNORMAL LOW (ref 1.7–2.4)

## 2016-02-09 LAB — TRIGLYCERIDES: Triglycerides: 107 mg/dL (ref <150)

## 2016-02-09 LAB — PHOSPHORUS
PHOSPHORUS: 1.3 mg/dL — AB (ref 2.5–4.6)
PHOSPHORUS: 1.8 mg/dL — AB (ref 2.5–4.6)
Phosphorus: 1.2 mg/dL — ABNORMAL LOW (ref 2.5–4.6)

## 2016-02-09 LAB — FIBRINOGEN: Fibrinogen: 210 mg/dL (ref 210–470)

## 2016-02-09 LAB — FIBRIN DEGRADATION PROD.(ARMC ONLY): Fibrin Degradation Prod.: >40 — AB (ref <10)

## 2016-02-09 LAB — LACTATE DEHYDROGENASE: LDH: 1006 U/L — AB (ref 98–192)

## 2016-02-09 LAB — FIBRIN DERIVATIVES D-DIMER (ARMC ONLY)

## 2016-02-09 MED ORDER — FENTANYL CITRATE (PF) 100 MCG/2ML IJ SOLN: 50.0000 ug | INTRAMUSCULAR | Status: DC | PRN

## 2016-02-09 MED ORDER — FREE WATER
30.0000 mL | Status: DC
  Administered 2016-02-09 – 2016-02-10 (×4): 30 mL

## 2016-02-09 MED ORDER — PROPOFOL 1000 MG/100ML IV EMUL
0.0000 ug/kg/min | INTRAVENOUS | Status: DC
  Administered 2016-02-09: 14 ug/kg/min via INTRAVENOUS
  Administered 2016-02-09: 10 ug/kg/min via INTRAVENOUS
  Administered 2016-02-10 – 2016-02-11 (×3): 15 ug/kg/min via INTRAVENOUS
  Administered 2016-02-11: 13 ug/kg/min via INTRAVENOUS
  Filled 2016-02-09 (×6): qty 100

## 2016-02-09 MED ORDER — VITAL AF 1.2 CAL PO LIQD
1000.0000 mL | ORAL | Status: DC
  Administered 2016-02-09: 1000 mL

## 2016-02-09 MED ORDER — TRACE MINERALS CR-CU-MN-SE-ZN 10-1000-500-60 MCG/ML IV SOLN
INTRAVENOUS | Status: AC
  Administered 2016-02-09: 18:00:00 via INTRAVENOUS
  Filled 2016-02-09: qty 960

## 2016-02-09 MED ORDER — PRO-STAT SUGAR FREE PO LIQD
30.0000 mL | ORAL | Status: DC
  Administered 2016-02-09 (×3): 30 mL

## 2016-02-09 MED ORDER — SODIUM PHOSPHATE 3 MMOLE/ML IV SOLN
30.0000 mmol | Freq: Once | INTRAVENOUS | Status: AC
  Administered 2016-02-09: 30 mmol via INTRAVENOUS
  Filled 2016-02-09: qty 10

## 2016-02-09 MED ORDER — LACTULOSE 10 GM/15ML PO SOLN
30.0000 g | Freq: Three times a day (TID) | ORAL | Status: DC
  Administered 2016-02-09 – 2016-02-11 (×7): 30 g via ORAL
  Filled 2016-02-09 (×7): qty 60

## 2016-02-09 MED ORDER — DOPAMINE-DEXTROSE 3.2-5 MG/ML-% IV SOLN
0.0000 ug/kg/min | INTRAVENOUS | Status: DC
  Administered 2016-02-09: 5 ug/kg/min via INTRAVENOUS
  Filled 2016-02-09: qty 250

## 2016-02-09 MED ORDER — VITAL HIGH PROTEIN PO LIQD: 1000.0000 mL | ORAL | Status: DC

## 2016-02-09 MED ORDER — INSULIN GLARGINE 100 UNIT/ML ~~LOC~~ SOLN
5.0000 [IU] | Freq: Once | SUBCUTANEOUS | Status: AC
  Administered 2016-02-09: 5 [IU] via SUBCUTANEOUS
  Filled 2016-02-09: qty 0.05

## 2016-02-09 MED ORDER — SODIUM PHOSPHATE 3 MMOLE/ML IV SOLN
15.0000 mmol | Freq: Once | INTRAVENOUS | Status: AC
  Administered 2016-02-09: 15 mmol via INTRAVENOUS
  Filled 2016-02-09: qty 5

## 2016-02-09 MED ORDER — INSULIN GLARGINE 100 UNIT/ML ~~LOC~~ SOLN
20.0000 [IU] | Freq: Every day | SUBCUTANEOUS | Status: DC
  Filled 2016-02-09: qty 0.2

## 2016-02-09 NOTE — Progress Notes (Signed)
Per Dr. Cherylann RatelLateef, ok to use purple port on trialysis cath for infusion.

## 2016-02-09 NOTE — Progress Notes (Signed)
PARENTERAL NUTRITION CONSULT NOTE - INITIAL  Pharmacy Consult for TPN Electrolyte and Glucose Monitoring  No Known Allergies  Patient Measurements: Height: 5\' 7"  (170.2 cm) Weight: 203 lb 4.2 oz (92.2 kg) IBW/kg (Calculated) : 66.1  Vital Signs: Temp: 97.8 F (36.6 C) (03/20 1900) Temp Source: Axillary (03/20 1900) Pulse Rate: 63 (03/20 2000) Intake/Output from previous day: 03/19 0701 - 03/20 0700 In: 1336.9 [I.V.:1136.9; IV Piggyback:200] Out: 4863  Intake/Output from this shift:    Labs:  Recent Labs  02/07/16 0449 02/07/16 0532 02/09/16 0446  WBC 25.7*  --  19.9*  HGB 8.8*  --  9.5*  HCT 26.7*  --  28.0*  PLT 20* 19* 14*  APTT  --  49*  --   INR  --  2.92  --      Recent Labs  02/07/16 0449  02/07/16 2356 02/08/16 0400  02/09/16 0446  02/09/16 1122 02/09/16 1631 02/09/16 1826  NA 128*  < > 131* 130*  --  132*  --   --   --   --   K 4.3  < > 4.1 4.1  --  4.0  --   --   --   --   CL 96*  < > 100* 100*  --  102  --   --   --   --   CO2 21*  < > 24 25  --  26  --   --   --   --   GLUCOSE 251*  < > 253* 271*  --  269*  --   --   --   --   BUN 21*  < > 20 23*  --  26*  --   --   --   --   CREATININE 1.85*  < > 1.40* 1.60*  --  1.27*  --   --   --   --   CALCIUM 7.1*  < > 7.6* 7.4*  --  7.7*  --   --   --   --   MG  --   < > 1.7 1.7  < >  --   < > 1.6* 1.7 1.6*  PHOS 2.6  --   --  1.4*  --  1.3*  --  1.2*  --  1.8*  PROT 5.0*  --   --   --   --  5.3*  --   --   --   --   ALBUMIN 2.4*  --   --   --   --  2.2*  --   --   --   --   AST 2466*  --   --   --   --  443*  --   --   --   --   ALT 577*  --   --   --   --  318*  --   --   --   --   ALKPHOS 205*  --   --   --   --  316*  --   --   --   --   BILITOT 5.7*  --   --   --   --  9.3*  --   --   --   --   TRIG  --   --   --   --   --   --   --  107  --   --   < > = values  in this interval not displayed. Estimated Creatinine Clearance: 61.9 mL/min (by C-G formula based on Cr of 1.27).    Recent Labs  02/09/16 1317 02/09/16 1622 02/09/16 1932  GLUCAP 253* 273* 294*    Medical History: Past Medical History  Diagnosis Date  . Hypertension   . Diabetes mellitus without complication (HCC)   . 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 alteplase  2 mg Intracatheter Once  . antiseptic oral rinse  7 mL Mouth Rinse Q2H  . chlorhexidine gluconate  15 mL Mouth Rinse BID  . feeding supplement (PRO-STAT SUGAR FREE 64)  30 mL Per Tube 6 times per day  . feeding supplement (VITAL AF 1.2 CAL)  1,000 mL Per Tube Q24H  . free water  30 mL Per Tube 6 times per day  . hydrocortisone sod succinate (SOLU-CORTEF) inj  50 mg Intravenous Q6H  . insulin aspart  0-15 Units Subcutaneous 6 times per day  . [START ON 02/10/2016] insulin glargine  20 Units Subcutaneous Daily  . lactulose  30 g Oral TID  . pantoprazole (PROTONIX) IV  40 mg Intravenous Daily  . piperacillin-tazobactam (ZOSYN)  IV  3.375 g Intravenous 3 times per day  . sodium chloride flush  10-40 mL Intracatheter Q12H  . thiamine IV  100 mg Intravenous Daily   Infusions:  . Marland KitchenTPN (CLINIMIX-E) Adult 40 mL/hr at 02/09/16 1752  . DOPamine Stopped (02/09/16 1308)  . epinephrine 15.013 mcg/min (02/09/16 2123)  . fentaNYL Stopped (02/09/16 1305)  . propofol (DIPRIVAN) infusion 14 mcg/kg/min (02/09/16 2122)  . pureflow 2,000 mL/hr at 02/09/16 0800    Insulin Requirements in the past 24 hours:  30 units SSI   Current Nutrition:  Clinimix 5/20 at 40 ml/hr   Assessment: 65 y/o M admitted with severe pancreatitis and renal failure on CRRT with orders to begin TPN.   Na= 132, K= 4.0, Mag= 1.7, Phos= 1.3  Plan:  Patient is ordered SSI and Lantus 15 units daily. Will increase Lantus to 20 units daily as per discussion on rounds due to blood sugars in the 200s.  K and Mg are WNL. Will replace phosporus with Naphos 30 mmol iv once and recheck phos in PM.    3/20 ~ 21:00 Phos = 1.8.  Ordered Sodium  Phosphate IV x 1 dose.  Will recheck phosphorus level with am labs.   Stormy Card, RPh Clinical Pharmacist 02/09/2016,9:55 PM

## 2016-02-09 NOTE — Progress Notes (Signed)
Subjective:  Patient was net -3.5 L over the preceding 24 hours. CRRT continues to progress well. Patient remains on the ventilator as well. Still on pressors.    Objective:  Vital signs in last 24 hours:  Temp:  [96.8 F (36 C)-98.6 F (37 C)] 98.6 F (37 C) (03/20 0700) Pulse Rate:  [59-78] 64 (03/20 0904) Resp:  [35] 35 (03/20 0700) BP: (110)/(64) 110/64 mmHg (03/20 0904) SpO2:  [91 %-100 %] 94 % (03/20 0904) Arterial Line BP: (67-144)/(44-81) 96/58 mmHg (03/20 0800) FiO2 (%):  [40 %] 40 % (03/20 0904) Weight:  [92.2 kg (203 lb 4.2 oz)] 92.2 kg (203 lb 4.2 oz) (03/20 0453)  Weight change: -3.9 kg (-8 lb 9.6 oz) Filed Weights   02/07/16 0434 02/08/16 0500 02/09/16 0453  Weight: 96 kg (211 lb 10.3 oz) 96.1 kg (211 lb 13.8 oz) 92.2 kg (203 lb 4.2 oz)    Intake/Output:    Intake/Output Summary (Last 24 hours) at 02/09/16 0950 Last data filed at 02/09/16 0800  Gross per 24 hour  Intake 1436.1 ml  Output   4461 ml  Net -3024.9 ml     Physical Exam: General: Critically ill appearing  HEENT Eyes closed, ETT  Neck supple  Pulm/lungs Coarse crackles b/l, Vent assisted  CVS/Heart S1S2 no rubs  Abdomen:  Soft, non distended, scant BS  Extremities: 3+ b/l LE edema  Neurologic: Sedated, not following commands  Skin: No acute rashes  Access: Right femoral dialysis catheter       Basic Metabolic Panel:   Recent Labs Lab 02/05/16 0644  02/06/16 0333  02/07/16 0449  02/07/16 1559 02/07/16 1831 02/07/16 2356 02/08/16 0400  02/08/16 1553 02/08/16 1832 02/09/16 0036 02/09/16 0305 02/09/16 0446 02/09/16 0647  NA 132*  132*  < > 124*  124*  < > 128*  < > 129* 129* 131* 130*  --   --   --   --   --  132*  --   K 4.6  4.6  < > 3.6  3.6  < > 4.3  < > 4.3 4.1 4.1 4.1  --   --   --   --   --  4.0  --   CL 97*  97*  < > 87*  88*  < > 96*  < > 99* 100* 100* 100*  --   --   --   --   --  102  --   CO2 11*  12*  < > 17*  16*  < > 21*  < > 23 20* 24 25  --   --    --   --   --  26  --   GLUCOSE 106*  105*  < > 185*  168*  < > 251*  < > 247* 243* 253* 271*  --   --   --   --   --  269*  --   BUN 39*  39*  < > 24*  26*  < > 21*  < > 21* 21* 20 23*  --   --   --   --   --  26*  --   CREATININE 3.78*  3.77*  < > 2.70*  2.79*  < > 1.85*  < > 1.57* 1.49* 1.40* 1.60*  --   --   --   --   --  1.27*  --   CALCIUM 6.6*  6.7*  < > 5.9*  5.9*  < >  7.1*  < > 7.6* 7.5* 7.6* 7.4*  --   --   --   --   --  7.7*  --   MG 1.3*  < > 1.7  < >  --   < > 1.7 1.7 1.7 1.7  < > 1.6* 1.7 1.7 1.6*  --  1.7  PHOS 7.6*  --  4.7*  --  2.6  --   --   --   --  1.4*  --   --   --   --   --  1.3*  --   < > = values in this interval not displayed.   CBC:  Recent Labs Lab 02/04/16 2018 02/05/16 0237 02/05/16 1049 02/06/16 0333 02/07/16 0449 02/07/16 0532 02/09/16 0446  WBC 9.5 7.4  --  14.2* 25.7*  --  19.9*  NEUTROABS  --   --   --   --   --   --  17.7*  HGB 8.7* 8.3*  --  7.3* 8.8*  --  9.5*  HCT 27.8* 26.0*  --  23.0* 26.7*  --  28.0*  MCV 109.6* 109.3*  --  105.6* 97.6  --  96.7  PLT 54* 45* 31* 26*  28* 20* 19* 14*      Microbiology:  Recent Results (from the past 720 hour(s))  MRSA PCR Screening     Status: None   Collection Time: 02/04/16  8:39 AM  Result Value Ref Range Status   MRSA by PCR NEGATIVE NEGATIVE Final    Comment:        The GeneXpert MRSA Assay (FDA approved for NASAL specimens only), is one component of a comprehensive MRSA colonization surveillance program. It is not intended to diagnose MRSA infection nor to guide or monitor treatment for MRSA infections.   Culture, blood (Routine X 2) w Reflex to ID Panel     Status: None   Collection Time: 02/04/16 12:34 PM  Result Value Ref Range Status   Specimen Description BLOOD RIGHT ARM  Final   Special Requests   Final    BOTTLES DRAWN AEROBIC AND ANAEROBIC AER ANA   Culture NO GROWTH 5 DAYS  Final   Report Status 02/09/2016 FINAL  Final  Culture, blood (Routine X 2) w  Reflex to ID Panel     Status: None   Collection Time: 02/04/16 12:49 PM  Result Value Ref Range Status   Specimen Description BLOOD RIGHT HAND  Final   Special Requests   Final    BOTTLES DRAWN AEROBIC AND ANAEROBIC AER ANA   Culture NO GROWTH 5 DAYS  Final   Report Status 02/09/2016 FINAL  Final  Culture, respiratory (NON-Expectorated)     Status: None   Collection Time: 02/04/16  2:50 PM  Result Value Ref Range Status   Specimen Description TRACHEAL ASPIRATE  Final   Special Requests NONE  Final   Gram Stain   Final    MODERATE GRAM POSITIVE COCCI IN CHAINS MODERATE RED BLOOD CELLS RARE WBC SEEN    Culture   Final    LIGHT GROWTH KLEBSIELLA PNEUMONIAE MODERATE GROWTH HAEMOPHILUS PARAINFLUENZAE BETA LACTAMASE POSITIVE    Report Status 02/08/2016 FINAL  Final   Organism ID, Bacteria KLEBSIELLA PNEUMONIAE  Final      Susceptibility   Klebsiella pneumoniae - MIC*    AMPICILLIN >=32 RESISTANT Resistant     CEFAZOLIN <=4 SENSITIVE Sensitive     CEFEPIME <=1 SENSITIVE Sensitive  CEFTAZIDIME <=1 SENSITIVE Sensitive     CEFTRIAXONE <=1 SENSITIVE Sensitive     CIPROFLOXACIN <=0.25 SENSITIVE Sensitive     GENTAMICIN <=1 SENSITIVE Sensitive     IMIPENEM <=0.25 SENSITIVE Sensitive     TRIMETH/SULFA <=20 SENSITIVE Sensitive     AMPICILLIN/SULBACTAM 4 SENSITIVE Sensitive     PIP/TAZO <=4 SENSITIVE Sensitive     Extended ESBL NEGATIVE Sensitive     * LIGHT GROWTH KLEBSIELLA PNEUMONIAE    Coagulation Studies:  Recent Labs  02/07/16 0532  LABPROT 30.0*  INR 2.92    Urinalysis: No results for input(s): COLORURINE, LABSPEC, PHURINE, GLUCOSEU, HGBUR, BILIRUBINUR, KETONESUR, PROTEINUR, UROBILINOGEN, NITRITE, LEUKOCYTESUR in the last 72 hours.  Invalid input(s): APPERANCEUR    Imaging: No results found.   Medications:   . Marland Kitchen.TPN (CLINIMIX-E) Adult 40 mL/hr at 02/08/16 1830  . epinephrine 14 mcg/min (02/09/16 0736)  . fentaNYL 75 mcg/hr (02/09/16 0605)  .  norepinephrine (LEVOPHED) Adult infusion Stopped (02/08/16 1017)  . pureflow 6,000 mL/hr at 02/07/16 1109  . vasopressin (PITRESSIN) infusion - *FOR SHOCK* Stopped (02/08/16 1123)   . alteplase  2 mg Intracatheter Once  . antiseptic oral rinse  7 mL Mouth Rinse Q2H  . chlorhexidine gluconate  15 mL Mouth Rinse BID  . dextrose  25 mL Intravenous Once  . hydrocortisone sod succinate (SOLU-CORTEF) inj  50 mg Intravenous Q6H  . insulin aspart  0-15 Units Subcutaneous 6 times per day  . insulin glargine  15 Units Subcutaneous Daily  . pantoprazole (PROTONIX) IV  40 mg Intravenous Daily  . piperacillin-tazobactam (ZOSYN)  IV  3.375 g Intravenous 3 times per day  . sodium chloride flush  10-40 mL Intracatheter Q12H  . thiamine IV  100 mg Intravenous Daily   fentaNYL, heparin, midazolam, ondansetron (ZOFRAN) IV, sodium chloride flush  Assessment/ Plan:  65 y.o. male with a PMHX of Coronary disease, cirrhosis, Esophageal varices, portal hypertensive gastropathy, type 2 diabetes, hypertension, left carotid surgery. peripheral vascular disease, was admitted on 02/02/2016 with abdominal pain/Acute pancreatitis.  3/15- AM - Code blue - transferred to ICU - CRRT started 3/16- Multiple pressors- CRRT contd. anuric 3/17- UF rate adjusted to 210cc/hr 3/20 UF rate adjusted down to 175cc/hr.  1. Acute renal failure. (Baseline Cr 0.9) with multiple organ failure, due to ATN.  Likely secondary to severe ATN from Acute pancreatitis secondary to alcohol use  -Patient was quite net negative yesterday. Over the preceding 24 hours he was net negative by 3.5 L. Therefore we will decrease ultrafiltration rate to 175 cc per hour.  2. High anion gap acidosis Lactic acidosis  -Most recent ABG showed correction of metabolic acidosis. Continue to periodically monitor.  3. Hyperkalemia Potassium 4.0 this a.m. and acceptable.  4. Acute respiratory failure. Patient with ARDS. -Continue ventilatory support. Continue  Zosyn for underlying pneumonia.  5.  Hyponatremia:  Sodium up to 132. Continue to monitor.      LOS: 6 Laure Leone 3/20/20179:50 AM

## 2016-02-09 NOTE — Progress Notes (Signed)
Nutrition Follow-up    INTERVENTION:   EN: discussed nutritional poc during ICU rounds and received verbal order from MD Ramachandran to start TF at low rate. Recommend starting Vital AF 1.2 at rate of 15 ml/hr, assess for tolerance. If tolerating, will establish recommendations for titration and goal rate on follow-up. Also recommend addition of Prostat 6 packets daily (each packet contains 100 kcals, 15 g of protein) to maximize protein intake at present. PN: continue 5%AA/20%Dextrose at 40 ml/hr at present; if pt unable to tolerate TF, will need to assess for titration of TPN to goal rate on follow-up. Pt with hypophosphatemia on CRRT, discussed supplementation during ICU rounds Nutrition related medication management: discussed constipation during ICU rounds, MD is aware   NUTRITION DIAGNOSIS:   Inadequate oral intake related to acute illness as evidenced by NPO status. Being addressed via TPN and TF  GOAL:   Provide needs based on ASPEN/SCCM guidelines  MONITOR:   Vent status, Labs, I & O's, Weight trends (Energy Intake)  REASON FOR ASSESSMENT:   Ventilator    ASSESSMENT:    Pt remains on vent, only on epinephrine, on CRRT with UF of 210 ml/hr, , remains anuric, diprivan added for sedation  Diet Order:  .TPN (CLINIMIX-E) Adult .TPN (CLINIMIX-E) Adult  PN: 5%AA/20%Dextrose at 40 ml/hr via right subclavian CVC  Digestive System: abdomen improved, softer, BS audible, no BM, OG in place, abdominal xray today with no acute findings  Skin:  Reviewed, no issues  Last BM:  3/14    Recent Labs Lab 02/07/16 2356 02/08/16 0400  02/09/16 0305 02/09/16 0446 02/09/16 0647 02/09/16 1122  NA 131* 130*  --   --  132*  --   --   K 4.1 4.1  --   --  4.0  --   --   CL 100* 100*  --   --  102  --   --   CO2 24 25  --   --  26  --   --   BUN 20 23*  --   --  26*  --   --   CREATININE 1.40* 1.60*  --   --  1.27*  --   --   CALCIUM 7.6* 7.4*  --   --  7.7*  --   --   MG 1.7  1.7  < > 1.6*  --  1.7 1.6*  PHOS  --  1.4*  --   --  1.3*  --  1.2*  GLUCOSE 253* 271*  --   --  269*  --   --   < > = values in this interval not displayed.  Glucose Profile:  Recent Labs  02/09/16 0057 02/09/16 0355 02/09/16 0900  GLUCAP 238* 213* 228*   Lipid Profile:     Component Value Date/Time   CHOL 85 02/04/2016 0502   TRIG 107 02/09/2016 1122   HDL <10* 02/04/2016 0502   CHOLHDL NOT CALCULATED 02/04/2016 0502   VLDL 30 02/04/2016 0502   LDLCALC NOT CALCULATED 02/04/2016 0502     Nutritional Anemia Profile:  CBC Latest Ref Rng 02/09/2016 02/07/2016 02/07/2016  WBC 3.8 - 10.6 K/uL 19.9(H) - 25.7(H)  Hemoglobin 13.0 - 18.0 g/dL 1.6(X9.5(L) - 8.8(L)  Hematocrit 40.0 - 52.0 % 28.0(L) - 26.7(L)  Platelets 150 - 440 K/uL 14(LL) 19(LL) 20(LL)    Lipase     Component Value Date/Time   LIPASE 112* 02/07/2016 0449   Meds: ss novolog, lantus, epinephrine, lactulose, pureflow solution  for dialysis 4K-3Ca-1Mg   Height:   Ht Readings from Last 1 Encounters:  02/01/2016  (1.702 m)    Weight:   Wt Readings from Last 1 Encounters:  02/09/16 203 lb 4.2 oz (92.2 kg)    Filed Weights   02/07/16 0434 02/08/16 0500 02/09/16 0453  Weight: 211 lb 10.3 oz (96 kg) 211 lb 13.8 oz (96.1 kg) 203 lb 4.2 oz (92.2 kg)    BMI:  Body mass index is 31.83 kg/(m^2).  Estimated Nutritional Needs:   Kcal:  1794 kcals (Ve: 12.5, Tmax: 36.4)   Protein:  131-174 g (1.5-2.0 g/kg)   Fluid:  2175-2610 mL (25-30 ml/kg)   HIGH Care Level  Romelle Starcher MS, RD, LDN [phone] Pager  [phone] Weekend/On-Call Pager

## 2016-02-09 NOTE — Progress Notes (Addendum)
PULMONARY / CRITICAL CARE MEDICINE   Name: Jay Mejia MRN: [id number] DOB: 28-Mar-1951    ADMISSION DATE:  02/01/2016    PT PROFILE:   74 M with alcoholic cirrhosis with active drinking, esophageal varices admitted 03/14 with pancreatitis, AKI, metabolic acidosis, hyperkalemia. Suffered asystolic cardiac arrest and underwent 4-5 mins of CPR including chest compressions and HCO3 with restoration of spontaneous circulation. He was intubated in course of ACLS. Course complicated by DIC, ARDS, severe shock. Currently on epi drip.    MAJOR EVENTS/TEST RESULTS: 03/14 Abd [place]: Gallstones and sludge in the gallbladder. There is gallbladder wall thickening. The sonographer does not report a positive sonographic Murphy sign. The findings are equivocal for cholecystitis. Diffuse increased echotexture of the liver, nonspecific but can be seen in fatty infiltration of liver. There is a nodular contour for of the liver surface suggesting changes of cirrhosis  03/15 CT head: NAICP 03/15 TTE: LVEF 50-55% 03/15 refractory shock despite DA, NE, VP and epinephrine infusions 03/15 CRRT initiated 03/15 refractory hypoxemia despite PEEP 18-20 cm H2O and FiO2 100% 03/16 Severe hypoxemia, refractory shock, severe acidosis, anuric renal failure persist 03/17 Improving gas exchange. Able to F/C on WUA. Remains on max vasopressors. Remains on CRRT with anuria. Acidosis improving. HCO3 gtt stopped. Severe abdominal distention - TPN initiated 3/20 Attempted to wean from epi drip to dopamine, became tachycardic and hypertensive with resp distress, switched back to epi.   INDWELLING DEVICES:: ETT 03/15 >>  L femoral HD cath 03/15 >>  R [place] CVL 05/15 >>  R femoral A-line 03/15 >>   MICRO DATA: MRSA PCR 03/15 >> NEG Resp 03/15 >> mod GPC chains >>  Blood 03/15 >>   ANTIMICROBIALS:  Pip-tazo 03/15 >>    SUBJECTIVE:  Pt sleeping. Does not open eyes.   VITAL SIGNS: BP 110/64 mmHg  Pulse 64  Temp(Src) 98.6  F (37 C) (Rectal)  Resp 35  Ht  (1.702 m)  Wt 203 lb 4.2 oz (92.2 kg)  BMI 31.83 kg/m2  SpO2 94%  HEMODYNAMICS:    VENTILATOR SETTINGS: Vent Mode:  [-] PRVC FiO2 (%):  [40 %] 40 % Set Rate:  [35 bmp] 35 bmp Vt Set:  [400 mL] 400 mL PEEP:  [12 cmH20] 12 cmH20 Plateau Pressure:  [30 cmH20-32 cmH20] 32 cmH20  INTAKE / OUTPUT: I/O last 3 completed shifts: In: 2569.3 [I.V.:1879.3; IV Piggyback:250] Out: 6512 [Other:6512]  PHYSICAL EXAMINATION: General: sedated, intubated, intermittent periods of agitation  Neuro: PERRL, MAEs HEENT: NCAT Cardiovascular: distant HS, regular, no M noted Lungs: diffuse bilateral dependent crackles Abdomen: distended, BS absent, no rebound Ext: warm, absent distal pulses Skin: multiple telangectasias  LABS:  BMET  Recent Labs Lab 02/07/16 2356 02/08/16 0400 02/09/16 0446  NA 131* 130* 132*  K 4.1 4.1 4.0  CL 100* 100* 102  CO2 BUN 20 23* 26*  CREATININE 1.40* 1.60* 1.27*  GLUCOSE 253* 271* 269*    Electrolytes  Recent Labs Lab 02/07/16 0449  02/07/16 2356 02/08/16 0400  02/09/16 0036 02/09/16 0305 02/09/16 0446 02/09/16 0647  CALCIUM 7.1*  < > 7.6* 7.4*  --   --   --  7.7*  --   MG  --   < > 1.7 1.7  < > 1.7 1.6*  --  1.7  PHOS 2.6  --   --  1.4*  --   --   --  1.3*  --   < > = values in this interval  not displayed.  CBC  Recent Labs Lab 02/06/16 0333 02/07/16 0449 02/07/16 0532 02/09/16 0446  WBC 14.2* 25.7*  --  19.9*  HGB 7.3* 8.8*  --  9.5*  HCT 23.0* 26.7*  --  28.0*  PLT 26*  28* 20* 19* 14*    Coag's  Recent Labs Lab 02/05/16 1049 02/06/16 0333 02/07/16 0532  APTT 88* 59* 49*  INR 5.61* 5.48* 2.92    Sepsis Markers  Recent Labs Lab 02/04/16 1044 02/04/16 1250 02/05/16 1049 02/06/16 0333 02/07/16 0449  LATICACIDVEN 6.6* 7.8*  --   --   --   PROCALCITON  --   --  0.96 1.82 2.58    ABG  Recent Labs Lab 02/05/16 1000 02/06/16 0450 02/08/16 0900  PHART 6.96*  7.18* 7.38  PCO2ART 49* 50* 38  PO2ART 60* 155* 110*    Liver Enzymes  Recent Labs Lab 02/06/16 0333 02/07/16 0449 02/09/16 0446  AST 2208* 2466* 443*  ALT 305* 577* 318*  ALKPHOS 130* 205* 316*  BILITOT 2.5* 5.7* 9.3*  ALBUMIN 2.7* 2.4* 2.2*    Cardiac Enzymes  Recent Labs Lab 02/04/16 0905 02/04/16 1447 02/04/16 2018  TROPONINI 0.10* 0.37* 0.84*    Glucose  Recent Labs Lab 02/08/16 1116 02/08/16 1609 02/08/16 2001 02/09/16 0057 02/09/16 0355 02/09/16 0900  GLUCAP 229* 220* 232* 238* 213* 228*    CXR: slightly improved edema /ARDS pattern   DISCUSSION: Principal Problem:   Pancreatitis, acute Active Problems:   Acute renal failure (HCC)   Hyperkalemia   Lactic acidosis   Acute respiratory failure with hypoxia (HCC)   ARDS (adult respiratory distress syndrome) (HCC)   Refractory shock (HCC)  Remains very critically ill with poor prognosis but has demonstrated some evidence of improvement since yesterday, especially with weaning of pressors (mainly epi) and improved neurologic status.   ASSESSMENT / PLAN:  PULMONARY A: Acute resp failure with hypoxia ARDS Severe hypoxemia - improving P:   Cont full vent support - settings reviewed and/or adjusted Will decrease PEEP from 12 to 10, may need to increase Fi02.  Daily SBT if/when meets criteria, currently not appropriate for weaning, still has high pressor and vent requirements.  Cont ARDS strategy - PEEP decreased to 10 cm H2O   CARDIOVASCULAR A:  Refractory shock - likely septic/SIRS P:  MAP goal > 65 mmHg Cont vasopressors including NE, did not tolerate changing to dopamine due to  Cont empiric hydrocortisone for possible relative adrenal insufficiency  RENAL A:   AKI Lactic acidosis, improving Hyperkalemia, resolved Hypokalemia Severe hypervolemia Hyponatremia Metabolic Acidosis - lactic - improving.  P:   Monitor BMET intermittently Monitor I/Os Correct electrolytes as  indicated DC HCO3 gtt 03/17 CRRT per Renal service, try to achieve -50cc/hr if BP will permit.  Net positive 1L, cont with UF@210  per renal recs  GASTROINTESTINAL A:   Acute pancreatitis, likely alcoholic Possible cholecystitis Cirrhotic changes on abd US Severe abdominal distention with ileus - too unstable presently for CT abd/pelvis Patient was noncompliant outpatient with the Xifaxin and lactulose. P:   SUP: IV PPI TPN initiated 03/17 If becomes more stable, would obtain CTAP lipase trend 143>112 Last drink per wife (3/11), in the window for DTs, will monitor closely, prn versed for sedation can also be used for DTs if needed  HEMATOLOGIC A:   Acute blood loss anemia Thrombocytopenia, due to DIC, cirrhosis, critical illness. Severe DIC P:  DVT px: SCDs Monitor CBC intermittently Transfuse per usual guidelines -   -  one unit PRBCs on 3/17 Avoid platelets and FFP in setting of DIC unless major bleeding   INFECTIOUS A:   Severe sepsis/SIRS Pancreatitis Possible cholecystitis Mildly elevated PCT P:   Monitor temp, WBC count Micro and abx as above  ENDOCRINE A:   DM2 - marginal control Presumed relative adrenal insufficiency P:   SSI, mod scale q 4 hrs Cont empiric hydrocortisone Lantus initiated 03/17 - adjusted based on requirements.   NEUROLOGIC A:   Acute encephalopathy  ICU/vent associated discomfort P:   RASS goal: -2 PAD protocol   Deep Nicholos Johns, M.D.      02/09/2016, 9:19 AM   Critical Care Attestation.  I have personally obtained a history, examined the patient, evaluated laboratory and imaging results, formulated the assessment and plan and placed orders. The Patient requires high complexity decision making for assessment and support, frequent evaluation and titration of therapies, application of advanced monitoring technologies and extensive interpretation of multiple databases. The patient has critical illness that could lead  imminently to failure of 1 or more organ systems and requires the highest level of physician preparedness to intervene.  Critical Care Time devoted to patient care services described in this note is 35 minutes and is exclusive of time spent in procedures.

## 2016-02-09 NOTE — Progress Notes (Signed)
[facility] - [place] at [facility]   PATIENT NAME: Jay BloomJanerik Mundie    MR#:  [id number]  DATE OF BIRTH:  1951/08/28  SUBJECTIVE:   Remains critically ill with multiorgan failure. Now only on IV epinephrine and fentanyl gtt  Remains Anuric.Has frank hematuria  Remains anemic/thrombocytopenic. On CRRT  REVIEW OF SYSTEMS:  Unable to obtain given medical condition/mental status  DRUG ALLERGIES:  No Known Allergies  VITALS:  Blood pressure 110/64, pulse 69, temperature 97.7 F (36.5 C), temperature source Axillary, resp. rate 35, height 5\' 7"  (1.702 m), weight 92.2 kg (203 lb 4.2 oz), SpO2 96 %.  PHYSICAL EXAMINATION:   VITAL SIGNS: Filed Vitals:   02/09/16 1300 02/09/16 1400  BP:    Pulse: 98 69  Temp:    Resp:      GENERAL:65 y.o.male critically ill, intubated and mechanically ventilated  HEAD: Normocephalic, atraumatic.  EYES: Pupils equal, round,  sluggishly reactive to light. Unable to assess extraocular muscles given mental status/medical condition. + scleral icterus.  MOUTH: Dry oral mucosa. Dentition intact. No abscess noted. ET/OG tube in place.  EAR, NOSE, THROAT: Clear without exudates. No external lesions.  NECK: Supple. No thyromegaly. No nodules. No JVD.  PULMONARY:  coarse breath sounds left greater than right without wheeze rales or rhonchi. No use of accessory muscles,poor respiratory effort. good air entry bilaterally CHEST: Nontender to palpation.  CARDIOVASCULAR: S1 and S2. Regular rate and rhythm. No murmurs, rubs, or gallops. No edema. Pedal pulses 2+ bilaterally.  GASTROINTESTINAL: Soft, nontender, nondistended. No masses. Positive bowel sounds. No hepatosplenomegaly. Foley catheter MUSCULOSKELETAL: No swelling, clubbing, +1-2 edema b/l NEUROLOGIC: Unable to assess given mental status/medical condition SKIN: No ulceration, lesions, rashes, or cyanosis. Skin warm and dry. Turgor intact.  PSYCHIATRIC: Unable to assess given mental  status/medical condition   LABORATORY PANEL:   CBC  Recent Labs Lab 02/09/16 0446  WBC 19.9*  HGB 9.5*  HCT 28.0*  PLT 14*   ------------------------------------------------------------------------------------------------------------------  Chemistries   Recent Labs Lab 02/09/16 0446  02/09/16 1122  NA 132*  --   --   K 4.0  --   --   CL 102  --   --   CO2 26  --   --   GLUCOSE 269*  --   --   BUN 26*  --   --   CREATININE 1.27*  --   --   CALCIUM 7.7*  --   --   MG  --   < > 1.6*  AST 443*  --   --   ALT 318*  --   --   ALKPHOS 316*  --   --   BILITOT 9.3*  --   --   < > = values in this interval not displayed. ------------------------------------------------------------------------------------------------------------------  Cardiac Enzymes  Recent Labs Lab 02/04/16 2018  TROPONINI 0.84*   ------------------------------------------------------------------------------------------------------------------  RADIOLOGY:  Dg Abd 1 View  02/09/2016  CLINICAL DATA:  Abdominal pain. Acute pancreatitis with constipation. EXAM: ABDOMEN - 1 VIEW COMPARISON:  One view abdomen 02/04/2016. FINDINGS: 1034 hours. Nasogastric tube is looped over the proximal stomach. There is a left femoral line which terminates over the upper sacrum. There is also a small caliber right femoral line which terminates over the right sacroiliac joint. Bilateral femoral vascular stents are present. The bowel gas pattern is normal. There is no supine evidence of free intraperitoneal air. No acute osseous findings are seen. IMPRESSION: No acute abdominal findings.  Support system positioned as  above. Electronically Signed   By: Carey Bullocks M.D.   On: 02/09/2016 12:44    ASSESSMENT AND PLAN:   65 -year-old Caucasian gentleman admitted Feb 13, 2016 with apparent alcoholic pancreatitis, to get him by acute kidney injury, hyperkalemia. It appears that he is under resisted from volume standpoint  1.  Post cardiac arrest: likely Related to hyperkalemia patient was not on off unit telemetry  - potassium has improved.  No arrhythmia.  Echo showing normal EF of 50-55%.   2. Acute respiratory failure hypoxemic requiring mechanical ventilation due to underlying ARDS post cardiac arrest and underlying acute pancreatitis. -Continue full vent support -Pulmonary following and defer vent management to them.    3. Acute kidney injury/ATN: With metabolic acidosis. Status post initiation of CRRT. Remains anuric. About 12 L + since admission.  -Remains hemodynamically unstable on 2 vasopressors and will continue CRRT and further care as per nephrology.  Cont. Fluid removal as per nephrology.  -Creatinine slowly trending down  4. Alcoholic pancreatitis with severe shock liver -US abdomen shows gallstones and sludge. S/o cholecystitis -empiric on Zosyn - cont. Supportive care w/ IV fluids, vasopressors (levophed,epinephrine and vasopressin) -LFTs remain elevated including LDH and PT/INR  5. Anemia/thrombocytopenia-due to underlying DIC/sepsis -Continue supportive care and follow counts. Transfuse as needed. Hold anticoagulants.  6. Shock-hypovolemic/septic. -Continue IV fluids, vasopressors, broad-spectrum IV antibiotics with Zosyn.  7. Hypokalemia - correct w/ CRRT as per Nephrology and will monitor.   8. DVT prophylaxis SCD and teds.  9. Nutrition on TPN  10. DM-2 on lantus and SSI  Pt. Remains critically ill with multi-organ failure and high risk for Cardioresp. Death. Prognosis remains poor  CODE STATUS:DNR  TOTAL Critical Care TIME TAKING CARE OF THIS PATIENT: 35 min.    Raynell Scott M.D on 02/09/2016 at 3:07 PM  Between 7am to 6pm - Pager - [phone]  After 6pm: House Pager: [phone]  [facility]  Office  [phone]  CC: Primary care physician; Clydie Braun, MD

## 2016-02-09 NOTE — Progress Notes (Signed)
PARENTERAL NUTRITION CONSULT NOTE - INITIAL  Pharmacy Consult for TPN Electrolyte and Glucose Monitoring  No Known Allergies  Patient Measurements: Height:  (170.2 cm) Weight: 203 lb 4.2 oz (92.2 kg) IBW/kg (Calculated) : 66.1  Vital Signs: Temp: 98.6 F (37 C) (03/20 0700) BP: 110/64 mmHg (03/20 0904) Pulse Rate: 63 (03/20 1000) Intake/Output from previous day: 03/19 0701 - 03/20 0700 In: 1336.9 [I.V.:1136.9; IV Piggyback:200] Out: 4660  Intake/Output from this shift: Total I/O In: 310.5 [I.V.:190.5; TPN:120] Out: 203 [Other:203]  Labs:  Recent Labs  02/07/16 0449 02/07/16 0532 02/09/16 0446  WBC 25.7*  --  19.9*  HGB 8.8*  --  9.5*  HCT 26.7*  --  28.0*  PLT 20* 19* 14*  APTT  --  49*  --   INR  --  2.92  --      Recent Labs  02/07/16 0449  02/07/16 2356 02/08/16 0400  02/09/16 0036 02/09/16 0305 02/09/16 0446 02/09/16 0647  NA 128*  < > 131* 130*  --   --   --  132*  --   K 4.3  < > 4.1 4.1  --   --   --  4.0  --   CL 96*  < > 100* 100*  --   --   --  102  --   CO2 21*  < > 24 25  --   --   --  26  --   GLUCOSE 251*  < > 253* 271*  --   --   --  269*  --   BUN 21*  < > 20 23*  --   --   --  26*  --   CREATININE 1.85*  < > 1.40* 1.60*  --   --   --  1.27*  --   CALCIUM 7.1*  < > 7.6* 7.4*  --   --   --  7.7*  --   MG  --   < > 1.7 1.7  < > 1.7 1.6*  --  1.7  PHOS 2.6  --   --  1.4*  --   --   --  1.3*  --   PROT 5.0*  --   --   --   --   --   --  5.3*  --   ALBUMIN 2.4*  --   --   --   --   --   --  2.2*  --   AST 2466*  --   --   --   --   --   --  443*  --   ALT 577*  --   --   --   --   --   --  318*  --   ALKPHOS 205*  --   --   --   --   --   --  316*  --   BILITOT 5.7*  --   --   --   --   --   --  9.3*  --   < > = values in this interval not displayed. Estimated Creatinine Clearance: 61.9 mL/min (by C-G formula based on Cr of 1.27).    Recent Labs  02/09/16 0057 02/09/16 0355 02/09/16 0900  GLUCAP 238* 213* 228*    Medical  History: Past Medical History  Diagnosis Date  . Hypertension   . Diabetes mellitus without complication (HCC)   . 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 alteplase  2 mg Intracatheter Once  . antiseptic oral rinse  7 mL Mouth Rinse Q2H  . chlorhexidine gluconate  15 mL Mouth Rinse BID  . feeding supplement (PRO-STAT SUGAR FREE 64)  30 mL Per Tube 6 times per day  . feeding supplement (VITAL AF 1.2 CAL)  1,000 mL Per Tube Q24H  . free water  30 mL Per Tube 6 times per day  . hydrocortisone sod succinate (SOLU-CORTEF) inj  50 mg Intravenous Q6H  . insulin aspart  0-15 Units Subcutaneous 6 times per day  . [START ON 02/10/2016] insulin glargine  20 Units Subcutaneous Daily  . insulin glargine  5 Units Subcutaneous Once  . lactulose  30 g Oral TID  . pantoprazole (PROTONIX) IV  40 mg Intravenous Daily  . piperacillin-tazobactam (ZOSYN)  IV  3.375 g Intravenous 3 times per day  . sodium chloride flush  10-40 mL Intracatheter Q12H  . thiamine IV  100 mg Intravenous Daily   Infusions:  . Marland Kitchen.TPN (CLINIMIX-E) Adult 40 mL/hr at 02/08/16 1830  . DOPamine    . epinephrine 14 mcg/min (02/09/16 0736)  . fentaNYL 100 mcg/hr (02/09/16 0930)  . propofol (DIPRIVAN) infusion    . pureflow 6,000 mL/hr at 02/07/16 1109    Insulin Requirements in the past 24 hours:  30 units SSI   Current Nutrition:  Clinimix 5/20 at 40 ml/hr   Assessment: 65 y/o M admitted with severe pancreatitis and renal failure on CRRT with orders to begin TPN.   Na= 132, K= 4.0, Mag= 1.7, Phos= 1.3  Plan:  Patient is ordered SSI and Lantus 15 units daily. Will increase Lantus to 20 units daily as per discussion on rounds due to blood sugars in the 200s.  K and Mg are WNL. Will replace phosporus with Naphos 30 mmol iv once and recheck phos in PM.   Jay Mejia, Jay Mejia Clinical Pharmacist 02/09/2016,11:51 AM

## 2016-02-09 NOTE — Progress Notes (Addendum)
Inpatient Diabetes Program Recommendations  AACE/ADA: New Consensus Statement on Inpatient Glycemic Control (2015)  Target Ranges:  Prepandial:   less than 140 mg/dL      Peak postprandial:   less than 180 mg/dL (1-2 hours)      Critically ill patients:  140 - 180 mg/dL   Review of Glycemic Control  Current orders for Inpatient glycemic control: Lantus 15 units daily, Novolog 0-15 units Q4H  Inpatient Diabetes Program Recommendations: Insulin - Basal: Please consider increasing Lantus to 20 units daily. Insulin-Correction: Since patient is critically ill, please consider discontinuing current Novolog scale and use the ICU Glycemic Control order set and order CBGs Q4H for tighter inpatient glycemic control. Insulin - TNA/tube feeding Coverage: Currently patient has TNA @ 40 ml/hr and Vital @ 20 ml/hr ordered. Please consider ordering Novolog 3 units Q4H for TNA and tube feeding coverage. If TNA and tube feeding held then Novolog TNA and/or tube feeding coverage would be held also.  Thanks, Orlando PennerMarie Izabel Chim, RN, MSN, CDE Diabetes Coordinator Inpatient Diabetes Program [phone] (Team Pager from 8am to 5pm) [phone] ([facility] office) [phone] [facility] office) [phone] [facility] office)

## 2016-02-09 NOTE — Progress Notes (Signed)
Pharmacy Antibiotic Note  Jay Mejia is a 65 y.o. male with acute renal failure requiring CRRT admitted on 01/27/2016 with severe pancreatitis.  Pharmacy has been consulted for Zosyn dosing.  PCT= 1.82  Plan: Continue Zosyn 3.375g IV q8h (4 hour infusion).  Height: 5\' 7"  (170.2 cm) Weight: 203 lb 4.2 oz (92.2 kg) IBW/kg (Calculated) : 66.1  Temp (24hrs), Avg:97.6 F (36.4 C), Min:96.8 F (36 C), Max:98.6 F (37 C)   Recent Labs Lab 02/04/16 1044  02/04/16 1250  02/04/16 2018  02/05/16 0237  02/06/16 0333  02/07/16 0449  02/07/16 1559 02/07/16 1831 02/07/16 2356 02/08/16 0400 02/09/16 0446  WBC  --   --   --   --  9.5  --  7.4  --  14.2*  --  25.7*  --   --   --   --   --  19.9*  CREATININE 5.81*  < >  --   < > 4.44*  < > 3.94*  < > 2.70*  2.79*  < > 1.85*  < > 1.57* 1.49* 1.40* 1.60* 1.27*  LATICACIDVEN 6.6*  --  7.8*  --   --   --   --   --   --   --   --   --   --   --   --   --   --   < > = values in this interval not displayed.  Estimated Creatinine Clearance: 61.9 mL/min (by C-G formula based on Cr of 1.27).    No Known Allergies  Antimicrobials this admission: Zosyn 3/15 >>    Dose adjustments this admission:   Microbiology results: 3/15 BCx: NGTD 3/15 TA: Klebsiella pneumoniae 3/15 MRSA PCR: negative  Thank you for allowing pharmacy to be a part of this patient's care.  Luisa HartChristy, Daira Hine D 02/09/2016 12:11 PM

## 2016-02-09 NOTE — Progress Notes (Signed)
PHARMACY - CRITICAL CARE PROGRESS NOTE  Pharmacy Consult for CRRT Medication Adjustment   No Known Allergies  Patient Measurements: Height:  (170.2 cm) Weight: 203 lb 4.2 oz (92.2 kg) IBW/kg (Calculated) : 66.1  Vital Signs: Temp: 98.6 F (37 C) (03/20 0700) BP: 110/64 mmHg (03/20 0904) Pulse Rate: 57 (03/20 1158) Intake/Output from previous day: 03/19 0701 - 03/20 0700 In: 1336.9 [I.V.:1136.9; IV Piggyback:200] Out: 4660  Intake/Output from this shift: Total I/O In: 310.5 [I.V.:190.5; TPN:120] Out: 203 [Other:203] Vent settings for last 24 hours: Vent Mode:  [-] PRVC FiO2 (%):  [40 %] 40 % Set Rate:  [35 bmp] 35 bmp Vt Set:  [400 mL] 400 mL PEEP:  [10 cmH20-12 cmH20] 10 cmH20 Plateau Pressure:  [30 cmH20-32 cmH20] 30 cmH20  Labs:  Recent Labs  02/07/16 0449 02/07/16 0532  02/07/16 2356 02/08/16 0400  02/09/16 0036 02/09/16 0305 02/09/16 0446 02/09/16 0647  WBC 25.7*  --   --   --   --   --   --   --  19.9*  --   HGB 8.8*  --   --   --   --   --   --   --  9.5*  --   HCT 26.7*  --   --   --   --   --   --   --  28.0*  --   PLT 20* 19*  --   --   --   --   --   --  14*  --   APTT  --  49*  --   --   --   --   --   --   --   --   INR  --  2.92  --   --   --   --   --   --   --   --   CREATININE 1.85*  --   < > 1.40* 1.60*  --   --   --  1.27*  --   MG  --   --   < > 1.7 1.7  < > 1.7 1.6*  --  1.7  PHOS 2.6  --   --   --  1.4*  --   --   --  1.3*  --   ALBUMIN 2.4*  --   --   --   --   --   --   --  2.2*  --   PROT 5.0*  --   --   --   --   --   --   --  5.3*  --   AST 2466*  --   --   --   --   --   --   --  443*  --   ALT 577*  --   --   --   --   --   --   --  318*  --   ALKPHOS 205*  --   --   --   --   --   --   --  316*  --   BILITOT 5.7*  --   --   --   --   --   --   --  9.3*  --   < > = values in this interval not displayed. Estimated Creatinine Clearance: 61.9 mL/min (by C-G formula based on Cr of 1.27).   Recent Labs  02/09/16 0057  02/09/16  0355 02/09/16 0900  GLUCAP 238* 213* 228*    Microbiology: Recent Results (from the past 720 hour(s))  MRSA PCR Screening     Status: None   Collection Time: 02/04/16  8:39 AM  Result Value Ref Range Status   MRSA by PCR NEGATIVE NEGATIVE Final    Comment:        The GeneXpert MRSA Assay (FDA approved for NASAL specimens only), is one component of a comprehensive MRSA colonization surveillance program. It is not intended to diagnose MRSA infection nor to guide or monitor treatment for MRSA infections.   Culture, blood (Routine X 2) w Reflex to ID Panel     Status: None   Collection Time: 02/04/16 12:34 PM  Result Value Ref Range Status   Specimen Description BLOOD RIGHT ARM  Final   Special Requests   Final    BOTTLES DRAWN AEROBIC AND ANAEROBIC AER ANA   Culture NO GROWTH 5 DAYS  Final   Report Status 02/09/2016 FINAL  Final  Culture, blood (Routine X 2) w Reflex to ID Panel     Status: None   Collection Time: 02/04/16 12:49 PM  Result Value Ref Range Status   Specimen Description BLOOD RIGHT HAND  Final   Special Requests   Final    BOTTLES DRAWN AEROBIC AND ANAEROBIC AER ANA   Culture NO GROWTH 5 DAYS  Final   Report Status 02/09/2016 FINAL  Final  Culture, respiratory (NON-Expectorated)     Status: None   Collection Time: 02/04/16  2:50 PM  Result Value Ref Range Status   Specimen Description TRACHEAL ASPIRATE  Final   Special Requests NONE  Final   Gram Stain   Final    MODERATE GRAM POSITIVE COCCI IN CHAINS MODERATE RED BLOOD CELLS RARE WBC SEEN    Culture   Final    LIGHT GROWTH KLEBSIELLA PNEUMONIAE MODERATE GROWTH HAEMOPHILUS PARAINFLUENZAE BETA LACTAMASE POSITIVE    Report Status 02/08/2016 FINAL  Final   Organism ID, Bacteria KLEBSIELLA PNEUMONIAE  Final      Susceptibility   Klebsiella pneumoniae - MIC*    AMPICILLIN >=32 RESISTANT Resistant     CEFAZOLIN <=4 SENSITIVE Sensitive     CEFEPIME <=1 SENSITIVE Sensitive      CEFTAZIDIME <=1 SENSITIVE Sensitive     CEFTRIAXONE <=1 SENSITIVE Sensitive     CIPROFLOXACIN <=0.25 SENSITIVE Sensitive     GENTAMICIN <=1 SENSITIVE Sensitive     IMIPENEM <=0.25 SENSITIVE Sensitive     TRIMETH/SULFA <=20 SENSITIVE Sensitive     AMPICILLIN/SULBACTAM 4 SENSITIVE Sensitive     PIP/TAZO <=4 SENSITIVE Sensitive     Extended ESBL NEGATIVE Sensitive     * LIGHT GROWTH KLEBSIELLA PNEUMONIAE    Medications:  Scheduled:  . alteplase  2 mg Intracatheter Once  . antiseptic oral rinse  7 mL Mouth Rinse Q2H  . chlorhexidine gluconate  15 mL Mouth Rinse BID  . feeding supplement (PRO-STAT SUGAR FREE 64)  30 mL Per Tube 6 times per day  . feeding supplement (VITAL AF 1.2 CAL)  1,000 mL Per Tube Q24H  . free water  30 mL Per Tube 6 times per day  . hydrocortisone sod succinate (SOLU-CORTEF) inj  50 mg Intravenous Q6H  . insulin aspart  0-15 Units Subcutaneous 6 times per day  . [START ON 02/10/2016] insulin glargine  20 Units Subcutaneous Daily  . insulin glargine  5 Units Subcutaneous Once  . lactulose  30 g Oral TID  .  pantoprazole (PROTONIX) IV  40 mg Intravenous Daily  . piperacillin-tazobactam (ZOSYN)  IV  3.375 g Intravenous 3 times per day  . sodium chloride flush  10-40 mL Intracatheter Q12H  . sodium phosphate  Dextrose 5% IVPB  30 mmol Intravenous Once  . thiamine IV  100 mg Intravenous Daily   Infusions:  . Marland Kitchen.TPN (CLINIMIX-E) Adult 40 mL/hr at 02/08/16 1830  . DOPamine    . epinephrine 14 mcg/min (02/09/16 0736)  . fentaNYL 100 mcg/hr (02/09/16 0930)  . propofol (DIPRIVAN) infusion    . pureflow 6,000 mL/hr at 02/07/16 1109    Assessment: Pharmacy consulted to assist in adjusting medications for CRRT in this 65 y/o M admitted with ARF and severe pancreatitis.   Plan:  No medications require adjustment for CRRT at present.   Pharmacy will continue to monitor and adjust per consult.   Luisa Harthristy, Olie Dibert D 02/09/2016,12:10 PM

## 2016-02-09 NOTE — Progress Notes (Signed)
Nurse attempted to wean patient from epinephrine drip and start dopamine drip.  Patient became tachycardic ( HR went from 60 to 110) and 02 SATs dropped into 80's.  Dopamine titrated off and epinephrine titrated back up and patient stabilized back to previous resting vitals.

## 2016-02-10 ENCOUNTER — Inpatient Hospital Stay: Payer: Medicare Other

## 2016-02-10 LAB — BLOOD GAS, ARTERIAL
ACID-BASE DEFICIT: 3 mmol/L — AB (ref 0.0–2.0)
Bicarbonate: 22 mEq/L (ref 21.0–28.0)
FIO2: 0.4
MECHANICAL RATE: 35
O2 SAT: 92.4 %
PEEP/CPAP: 10 cmH2O
PO2 ART: 67 mmHg — AB (ref 83.0–108.0)
Patient temperature: 37
VT: 400 mL
pCO2 arterial: 38 mmHg (ref 32.0–48.0)
pH, Arterial: 7.37 (ref 7.350–7.450)

## 2016-02-10 LAB — CBC
HCT: 30.8 % — ABNORMAL LOW (ref 40.0–52.0)
Hemoglobin: 10.4 g/dL — ABNORMAL LOW (ref 13.0–18.0)
MCH: 33 pg (ref 26.0–34.0)
MCHC: 33.6 g/dL (ref 32.0–36.0)
MCV: 98.1 fL (ref 80.0–100.0)
PLATELETS: 15 10*3/uL — AB (ref 150–440)
RBC: 3.15 MIL/uL — ABNORMAL LOW (ref 4.40–5.90)
RDW: 18.5 % — AB (ref 11.5–14.5)
WBC: 17.4 10*3/uL — AB (ref 3.8–10.6)

## 2016-02-10 LAB — GLUCOSE, CAPILLARY
GLUCOSE-CAPILLARY: 334 mg/dL — AB (ref 65–99)
GLUCOSE-CAPILLARY: 287 mg/dL — AB (ref 65–99)
GLUCOSE-CAPILLARY: 374 mg/dL — AB (ref 65–99)
Glucose-Capillary: 291 mg/dL — ABNORMAL HIGH (ref 65–99)
Glucose-Capillary: 328 mg/dL — ABNORMAL HIGH (ref 65–99)
Glucose-Capillary: 349 mg/dL — ABNORMAL HIGH (ref 65–99)
Glucose-Capillary: 332 mg/dL — ABNORMAL HIGH (ref 65–99)
Glucose-Capillary: 361 mg/dL — ABNORMAL HIGH (ref 65–99)
Glucose-Capillary: 350 mg/dL — ABNORMAL HIGH (ref 65–99)

## 2016-02-10 LAB — MAGNESIUM
MAGNESIUM: 1.6 mg/dL — AB (ref 1.7–2.4)
MAGNESIUM: 1.7 mg/dL (ref 1.7–2.4)
MAGNESIUM: 1.7 mg/dL (ref 1.7–2.4)
MAGNESIUM: 1.7 mg/dL (ref 1.7–2.4)
Magnesium: 1.7 mg/dL (ref 1.7–2.4)

## 2016-02-10 LAB — BASIC METABOLIC PANEL
Anion gap: 5 (ref 5–15)
BUN: 31 mg/dL — AB (ref 6–20)
CALCIUM: 7.6 mg/dL — AB (ref 8.9–10.3)
CO2: 24 mmol/L (ref 22–32)
CREATININE: 1.2 mg/dL (ref 0.61–1.24)
Chloride: 102 mmol/L (ref 101–111)
GFR calc non Af Amer: >60 mL/min (ref >60)
Glucose, Bld: 354 mg/dL — ABNORMAL HIGH (ref 65–99)
Potassium: 4.1 mmol/L (ref 3.5–5.1)
Sodium: 131 mmol/L — ABNORMAL LOW (ref 135–145)

## 2016-02-10 LAB — PHOSPHORUS: PHOSPHORUS: 2.2 mg/dL — AB (ref 2.5–4.6)

## 2016-02-10 MED ORDER — VASOPRESSIN 20 UNIT/ML IV SOLN
0.0300 [IU]/min | INTRAVENOUS | Status: DC
  Administered 2016-02-10 – 2016-02-11 (×2): 0.03 [IU]/min via INTRAVENOUS
  Filled 2016-02-10 (×2): qty 2

## 2016-02-10 MED ORDER — DIAZEPAM 5 MG/ML IJ SOLN
5.0000 mg | Freq: Four times a day (QID) | INTRAMUSCULAR | Status: DC
  Administered 2016-02-10 – 2016-02-11 (×3): 5 mg via INTRAVENOUS
  Filled 2016-02-10 (×3): qty 2

## 2016-02-10 MED ORDER — NOREPINEPHRINE BITARTRATE 1 MG/ML IV SOLN
0.0000 ug/min | INTRAVENOUS | Status: DC
  Administered 2016-02-10: 2 ug/min via INTRAVENOUS
  Administered 2016-02-11: 45 ug/min via INTRAVENOUS
  Administered 2016-02-11: 30 ug/min via INTRAVENOUS
  Filled 2016-02-10 (×3): qty 16

## 2016-02-10 MED ORDER — SODIUM CHLORIDE 0.9 % IV SOLN
INTRAVENOUS | Status: DC
  Administered 2016-02-10: 2.7 [IU]/h via INTRAVENOUS
  Administered 2016-02-11: 13:00:00 via INTRAVENOUS
  Filled 2016-02-10 (×2): qty 2.5

## 2016-02-10 MED ORDER — STERILE WATER FOR INJECTION IJ SOLN
INTRAMUSCULAR | Status: AC
  Administered 2016-02-10: 10 mL
  Filled 2016-02-10: qty 10

## 2016-02-10 MED ORDER — ALBUTEROL SULFATE (2.5 MG/3ML) 0.083% IN NEBU
INHALATION_SOLUTION | RESPIRATORY_TRACT | Status: AC
  Administered 2016-02-10: 2.5 mg
  Filled 2016-02-10: qty 3

## 2016-02-10 MED ORDER — TRACE MINERALS CR-CU-MN-SE-ZN 10-1000-500-60 MCG/ML IV SOLN
INTRAVENOUS | Status: DC
  Administered 2016-02-10: 18:00:00 via INTRAVENOUS
  Filled 2016-02-10: qty 960

## 2016-02-10 MED ORDER — SODIUM PHOSPHATE 3 MMOLE/ML IV SOLN
20.0000 mmol | Freq: Once | INTRAVENOUS | Status: AC
  Administered 2016-02-10: 20 mmol via INTRAVENOUS
  Filled 2016-02-10: qty 6.67

## 2016-02-10 NOTE — Progress Notes (Signed)
Subjective:  Patient making very little urine. Was net negative yesterday. CRRT continues to progress well. Patient remains on the ventilator and pressors.   Objective:  Vital signs in last 24 hours:  Temp:  [97.6 F (36.4 C)-98.1 F (36.7 C)] 98.1 F (36.7 C) (03/21 0731) Pulse Rate:  [57-98] 70 (03/21 0800) SpO2:  [87 %-100 %] 94 % (03/21 0842) Arterial Line BP: (85-129)/(50-76) 97/58 mmHg (03/21 0800) FiO2 (%):  [40 %-50 %] 40 % (03/21 0842) Weight:  [88.6 kg (195 lb 5.2 oz)] 88.6 kg (195 lb 5.2 oz) (03/21 0426)  Weight change: -3.6 kg (-7 lb 15 oz) Filed Weights   02/08/16 0500 02/09/16 0453 02/10/16 0426  Weight: 96.1 kg (211 lb 13.8 oz) 92.2 kg (203 lb 4.2 oz) 88.6 kg (195 lb 5.2 oz)    Intake/Output:    Intake/Output Summary (Last 24 hours) at 02/10/16 0943 Last data filed at 02/10/16 0800  Gross per 24 hour  Intake 3461.12 ml  Output   5235 ml  Net -1773.88 ml     Physical Exam: General: Critically ill appearing  HEENT Eyes closed, ETT  Neck supple  Pulm/lungs Coarse crackles b/l, Vent assisted  CVS/Heart S1S2 no rubs  Abdomen:  Soft, non distended, scant BS  Extremities: 2+ b/l LE edema  Neurologic: Sedated, not following commands  Skin: No acute rashes  Access: Right femoral dialysis catheter       Basic Metabolic Panel:   Recent Labs Lab 02/07/16 1831 02/07/16 2356 02/08/16 0400  02/09/16 0446  02/09/16 1122  02/09/16 1826 02/09/16 2333 02/10/16 0319 02/10/16 0430 02/10/16 0635  NA 129* 131* 130*  --  132*  --   --   --   --   --   --  131*  --   K 4.1 4.1 4.1  --  4.0  --   --   --   --   --   --  4.1  --   CL 100* 100* 100*  --  102  --   --   --   --   --   --  102  --   CO2 20* 24 25  --  26  --   --   --   --   --   --  24  --   GLUCOSE 243* 253* 271*  --  269*  --   --   --   --   --   --  354*  --   BUN 21* 20 23*  --  26*  --   --   --   --   --   --  31*  --   CREATININE 1.49* 1.40* 1.60*  --  1.27*  --   --   --   --   --    --  1.20  --   CALCIUM 7.5* 7.6* 7.4*  --  7.7*  --   --   --   --   --   --  7.6*  --   MG 1.7 1.7 1.7  < >  --   < > 1.6*  < > 1.6* 1.7 1.7 1.6* 1.7  PHOS  --   --  1.4*  --  1.3*  --  1.2*  --  1.8*  --   --  2.2*  --   < > = values in this interval not displayed.   CBC:  Recent Labs Lab 02/05/16 0237  02/06/16  4098 02/07/16 0449 02/07/16 0532 02/09/16 0446 02/10/16 0430  WBC 7.4  --  14.2* 25.7*  --  19.9* 17.4*  NEUTROABS  --   --   --   --   --  17.7*  --   HGB 8.3*  --  7.3* 8.8*  --  9.5* 10.4*  HCT 26.0*  --  23.0* 26.7*  --  28.0* 30.8*  MCV 109.3*  --  105.6* 97.6  --  96.7 98.1  PLT 45*  < > 26*  28* 20* 19* 14* 15*  < > = values in this interval not displayed.    Microbiology:  Recent Results (from the past 720 hour(s))  MRSA PCR Screening     Status: None   Collection Time: 02/04/16  8:39 AM  Result Value Ref Range Status   MRSA by PCR NEGATIVE NEGATIVE Final    Comment:        The GeneXpert MRSA Assay (FDA approved for NASAL specimens only), is one component of a comprehensive MRSA colonization surveillance program. It is not intended to diagnose MRSA infection nor to guide or monitor treatment for MRSA infections.   Culture, blood (Routine X 2) w Reflex to ID Panel     Status: None   Collection Time: 02/04/16 12:34 PM  Result Value Ref Range Status   Specimen Description BLOOD RIGHT ARM  Final   Special Requests   Final    BOTTLES DRAWN AEROBIC AND ANAEROBIC AER ANA   Culture NO GROWTH 5 DAYS  Final   Report Status 02/09/2016 FINAL  Final  Culture, blood (Routine X 2) w Reflex to ID Panel     Status: None   Collection Time: 02/04/16 12:49 PM  Result Value Ref Range Status   Specimen Description BLOOD RIGHT HAND  Final   Special Requests   Final    BOTTLES DRAWN AEROBIC AND ANAEROBIC AER ANA   Culture NO GROWTH 5 DAYS  Final   Report Status 02/09/2016 FINAL  Final  Culture, respiratory (NON-Expectorated)     Status: None    Collection Time: 02/04/16  2:50 PM  Result Value Ref Range Status   Specimen Description TRACHEAL ASPIRATE  Final   Special Requests NONE  Final   Gram Stain   Final    MODERATE GRAM POSITIVE COCCI IN CHAINS MODERATE RED BLOOD CELLS RARE WBC SEEN    Culture   Final    LIGHT GROWTH KLEBSIELLA PNEUMONIAE MODERATE GROWTH HAEMOPHILUS PARAINFLUENZAE BETA LACTAMASE POSITIVE    Report Status 02/08/2016 FINAL  Final   Organism ID, Bacteria KLEBSIELLA PNEUMONIAE  Final      Susceptibility   Klebsiella pneumoniae - MIC*    AMPICILLIN >=32 RESISTANT Resistant     CEFAZOLIN <=4 SENSITIVE Sensitive     CEFEPIME <=1 SENSITIVE Sensitive     CEFTAZIDIME <=1 SENSITIVE Sensitive     CEFTRIAXONE <=1 SENSITIVE Sensitive     CIPROFLOXACIN <=0.25 SENSITIVE Sensitive     GENTAMICIN <=1 SENSITIVE Sensitive     IMIPENEM <=0.25 SENSITIVE Sensitive     TRIMETH/SULFA <=20 SENSITIVE Sensitive     AMPICILLIN/SULBACTAM 4 SENSITIVE Sensitive     PIP/TAZO <=4 SENSITIVE Sensitive     Extended ESBL NEGATIVE Sensitive     * LIGHT GROWTH KLEBSIELLA PNEUMONIAE    Coagulation Studies: No results for input(s): LABPROT, INR in the last 72 hours.  Urinalysis: No results for input(s): COLORURINE, LABSPEC, PHURINE, GLUCOSEU, HGBUR, BILIRUBINUR, KETONESUR, PROTEINUR, UROBILINOGEN, NITRITE, LEUKOCYTESUR in  the last 72 hours.  Invalid input(s): APPERANCEUR    Imaging: Dg Chest 1 View  02/10/2016  CLINICAL DATA:  Shortness of breath. EXAM: CHEST 1 VIEW COMPARISON:  02/07/2016. FINDINGS: Endotracheal tube, NG tube, right IJ line stable position. Mediastinum hilar structures normal. Left lower lobe infiltrate noted most likely pneumonia. Small left pleural effusion. Interim partial clearing of right base infiltrate and/or atelectasis. IMPRESSION: 1. Lines and tubes stable position. 2. Left lower lobe infiltrate most likely secondary to pneumonia. Small left pleural effusion cannot be excluded. 3. Interim near complete  clearing of right base atelectasis and or infiltrate. Electronically Signed   By: Maisie Fus  Register   On: 02/10/2016 07:43   Dg Abd 1 View  02/09/2016  CLINICAL DATA:  Abdominal pain. Acute pancreatitis with constipation. EXAM: ABDOMEN - 1 VIEW COMPARISON:  One view abdomen 02/04/2016. FINDINGS: 1034 hours. Nasogastric tube is looped over the proximal stomach. There is a left femoral line which terminates over the upper sacrum. There is also a small caliber right femoral line which terminates over the right sacroiliac joint. Bilateral femoral vascular stents are present. The bowel gas pattern is normal. There is no supine evidence of free intraperitoneal air. No acute osseous findings are seen. IMPRESSION: No acute abdominal findings.  Support system positioned as above. Electronically Signed   By: Carey Bullocks M.D.   On: 02/09/2016 12:44     Medications:   . Marland KitchenTPN (CLINIMIX-E) Adult 40 mL/hr at 02/09/16 1752  . epinephrine 14 mcg/min (02/10/16 0910)  . fentaNYL Stopped (02/09/16 1305)  . norepinephrine (LEVOPHED) Adult infusion 7 mcg/min (02/10/16 0914)  . propofol (DIPRIVAN) infusion 15 mcg/kg/min (02/10/16 0335)  . pureflow 2,000 mL/hr at 02/10/16 0259   . alteplase  2 mg Intracatheter Once  . antiseptic oral rinse  7 mL Mouth Rinse Q2H  . chlorhexidine gluconate  15 mL Mouth Rinse BID  . free water  30 mL Per Tube 6 times per day  . hydrocortisone sod succinate (SOLU-CORTEF) inj  50 mg Intravenous Q6H  . insulin aspart  0-15 Units Subcutaneous 6 times per day  . insulin glargine  20 Units Subcutaneous Daily  . lactulose  30 g Oral TID  . pantoprazole (PROTONIX) IV  40 mg Intravenous Daily  . piperacillin-tazobactam (ZOSYN)  IV  3.375 g Intravenous 3 times per day  . sodium chloride flush  10-40 mL Intracatheter Q12H  . sodium phosphate  Dextrose 5% IVPB  20 mmol Intravenous Once  . thiamine IV  100 mg Intravenous Daily   fentaNYL, fentaNYL (SUBLIMAZE) injection, heparin, midazolam,  sodium chloride flush  Assessment/ Plan:  65 y.o. male with a PMHX of Coronary disease, cirrhosis, Esophageal varices, portal hypertensive gastropathy, type 2 diabetes, hypertension, left carotid surgery. peripheral vascular disease, was admitted on 02/19/2016 with abdominal pain/Acute pancreatitis.  3/15- AM - Code blue - transferred to ICU - CRRT started 3/16- Multiple pressors- CRRT contd. anuric 3/17- UF rate adjusted to 210cc/hr 3/20 UF rate adjusted down to 175cc/hr.  1. Acute renal failure. (Baseline Cr 0.9) with multiple organ failure, due to ATN.  Likely secondary to severe ATN from Acute pancreatitis secondary to alcohol use  -Patient still making very little urine and remains on pressor therapy. We will continue the patient on CRRT at this time with the goal ultrafiltration rate of 175 cc per hour.  2. High anion gap acidosis Lactic acidosis  -Acidosis resolved. PH currently 7.37. Continue renal replacement therapy.  3. Hyperkalemia Resolved with CRRT potassium currently  4.1.  4. Acute respiratory failure. Patient with ARDS. -Patient remains on the ventilator. FiO2 stable at 40%. Continue ultrafiltration at its current rate to assist with weaning from the ventilator.  5.  Hyponatremia:  Sodium stable at 131. Continue to monitor.  6.  Thrombocytopenia. Likely related to severe sepsis. Defer management to pulmonary critical care.      LOS: 7 Gaelyn Tukes 3/21/20179:43 AM

## 2016-02-10 NOTE — Progress Notes (Signed)
Inpatient Diabetes Program Recommendations  AACE/ADA: New Consensus Statement on Inpatient Glycemic Control (2015)  Target Ranges:  Prepandial:   less than 140 mg/dL      Peak postprandial:   less than 180 mg/dL (1-2 hours)      Critically ill patients:  140 - 180 mg/dL  Results for Jay BalloonORDH, Jay Mejia (MRN [id number]) as of 02/10/2016 10:29  Ref. Range 02/09/2016 03:55 02/09/2016 09:00 02/09/2016 13:17 02/09/2016 16:22 02/09/2016 19:32 02/10/2016 01:05 02/10/2016 03:47 02/10/2016 07:18  Glucose-Capillary Latest Ref Range: 65-99 mg/dL 086213 (H)  Novolog 5 units 228 (H)  Novolog 5 units  Lantus 15 units @9 :13 253 (H)  Novolog 8 units  Lantus 5 units @ 12:31 273 (H)  Novolog 8 units 294 (H)  Novolog 8 units 334 (H)  Novolog 11 units 291 (H)  Novolog 8 units 287 (H)     Review of Glycemic Control  Current orders for Inpatient glycemic control: Lantus 20 units daily, Novolog 0-15 units Q4H  Inpatient Diabetes Program Recommendations: Insulin - Basal: Please consider increasing Lantus to 25 units daily. Insulin - TNA Coverage: Please consider ordering Novolog 3 units Q4H for TNA coverage. If TNA and tube feeding held then Novolog TNA and/or tube feeding coverage would be held also. Insulin-Correction: Since patient is critically ill and glucose is higher than target goals, please consider discontinuing current Novolog scale and use the ICU Glycemic Control order set and order CBGs Q4H for tighter inpatient glycemic control.  Thanks, Orlando PennerMarie Madolyn Ackroyd, RN, MSN, CDE Diabetes Coordinator Inpatient Diabetes Program [phone] (Team Pager from 8am to 5pm) [phone] ([facility] office) [phone] [facility] office) [phone] [facility] office)

## 2016-02-10 NOTE — Progress Notes (Signed)
[facility] [place] [facility]   PATIENT NAME: Jay BloomJanerik Mejia    MR#:  [id number]  DATE OF BIRTH:  21-Mar-1951  SUBJECTIVE:   Remains critically ill with multiorgan failure. Now only on IV epinephrine and propofol gtt  Remains Anuric.Has frank hematuria  Remains anemic/thrombocytopenic. On CRRT Wife at bedside  REVIEW OF SYSTEMS:  Unable to obtain given medical condition/mental status  DRUG ALLERGIES:  No Known Allergies  VITALS:  Blood pressure 110/64, pulse 84, temperature 97.9 F (36.6 C), temperature source Axillary, resp. rate 35, height 5\' 7"  (1.702 m), weight 88.6 kg (195 lb 5.2 oz), SpO2 100 %.  PHYSICAL EXAMINATION:   VITAL SIGNS: Filed Vitals:   02/10/16 1500 02/10/16 1521  BP:    Pulse: 86 84  Temp: 97.9 F (36.6 C)   Resp: 35 35    GENERAL:64 y.o.male critically ill, intubated and mechanically ventilated  HEAD: Normocephalic, atraumatic.  EYES: Pupils equal, round,  sluggishly reactive to light. Unable to assess extraocular muscles given mental status/medical condition. + scleral icterus.  MOUTH: Dry oral mucosa. Dentition intact. No abscess noted. ET/OG tube in place.  EAR, NOSE, THROAT: Clear without exudates. No external lesions.  NECK: Supple. No thyromegaly. No nodules. No JVD.  PULMONARY:  coarse breath sounds left greater than right without wheeze rales or rhonchi. No use of accessory muscles,poor respiratory effort. good air entry bilaterally CHEST: Nontender to palpation.  CARDIOVASCULAR: S1 and S2. Regular rate and rhythm. No murmurs, rubs, or gallops. No edema. Pedal pulses 2+ bilaterally.  GASTROINTESTINAL: Soft, nontender, nondistended. No masses. Positive bowel sounds. No hepatosplenomegaly. Foley catheter MUSCULOSKELETAL: No swelling, clubbing, +1-2 edema b/l NEUROLOGIC: Unable to assess given mental status/medical condition SKIN: No ulceration, lesions, rashes, or cyanosis. Skin warm and dry. Turgor intact.   PSYCHIATRIC: Unable to assess given mental status/medical condition   LABORATORY PANEL:   CBC  Recent Labs Lab 02/10/16 0430  WBC 17.4*  HGB 10.4*  HCT 30.8*  PLT 15*   ------------------------------------------------------------------------------------------------------------------  Chemistries   Recent Labs Lab 02/09/16 0446  02/10/16 0430 02/10/16 0635  NA 132*  --  131*  --   K 4.0  --  4.1  --   CL 102  --  102  --   CO2 26  --  24  --   GLUCOSE 269*  --  354*  --   BUN 26*  --  31*  --   CREATININE 1.27*  --  1.20  --   CALCIUM 7.7*  --  7.6*  --   MG  --   < > 1.6* 1.7  AST 443*  --   --   --   ALT 318*  --   --   --   ALKPHOS 316*  --   --   --   BILITOT 9.3*  --   --   --   < > = values in this interval not displayed. ------------------------------------------------------------------------------------------------------------------  Cardiac Enzymes  Recent Labs Lab 02/04/16 2018  TROPONINI 0.84*   ------------------------------------------------------------------------------------------------------------------  RADIOLOGY:  Dg Chest 1 View  02/10/2016  CLINICAL DATA:  Shortness of breath. EXAM: CHEST 1 VIEW COMPARISON:  02/07/2016. FINDINGS: Endotracheal tube, NG tube, right IJ line stable position. Mediastinum hilar structures normal. Left lower lobe infiltrate noted most likely pneumonia. Small left pleural effusion. Interim partial clearing of right base infiltrate and/or atelectasis. IMPRESSION: 1. Lines and tubes stable position. 2. Left lower lobe infiltrate most likely secondary to pneumonia. Small left  pleural effusion cannot be excluded. 3. Interim near complete clearing of right base atelectasis and or infiltrate. Electronically Signed   By: Maisie Fus  Register   On: 02/10/2016 07:43   Dg Abd 1 View  02/09/2016  CLINICAL DATA:  Abdominal pain. Acute pancreatitis with constipation. EXAM: ABDOMEN - 1 VIEW COMPARISON:  One view abdomen 02/04/2016.  FINDINGS: 1034 hours. Nasogastric tube is looped over the proximal stomach. There is a left femoral line which terminates over the upper sacrum. There is also a small caliber right femoral line which terminates over the right sacroiliac joint. Bilateral femoral vascular stents are present. The bowel gas pattern is normal. There is no supine evidence of free intraperitoneal air. No acute osseous findings are seen. IMPRESSION: No acute abdominal findings.  Support system positioned as above. Electronically Signed   By: Carey Bullocks M.D.   On: 02/09/2016 12:44    ASSESSMENT AND PLAN:   71 -year-old Caucasian gentleman admitted 02/09/2016 with apparent alcoholic pancreatitis, to get him by acute kidney injury, hyperkalemia. It appears that he is under resisted from volume standpoint  1. Post cardiac arrest: likely Related to hyperkalemia patient was not on off unit telemetry  -No arrhythmia.  Echo showing normal EF of 50-55%.   2. Acute respiratory failure hypoxemic requiring mechanical ventilation due to underlying ARDS post cardiac arrest and underlying acute pancreatitis. -Continue full vent support -Pulmonary following and defer vent management to them.    3. Acute kidney injury/ATN: With metabolic acidosis. Status post initiation of CRRT. Remains anuric. -Remains hemodynamically unstable on 1 vasopressors and will continue CRRT and further care as per nephrology.  Cont. Fluid removal as per nephrology.  -Creatinine slowly trending down  4. Alcoholic pancreatitis with severe shock liver -US abdomen shows gallstones and sludge. S/o cholecystitis -empiric on Zosyn - cont. Supportive care w/ IV fluids, vasopressors (epinephrine ) -LFTs remain elevated including LDH and PT/INR  5. Anemia/thrombocytopenia-due to underlying DIC/sepsis -Continue supportive care and follow counts. Transfuse as needed. Hold anticoagulants.  6. Shock-hypovolemic/septic. -Continue IV fluids, vasopressors,  broad-spectrum IV antibiotics with Zosyn.  7. Hypokalemia - correct w/ CRRT as per Nephrology and will monitor.   8. DVT prophylaxis SCD and teds.  9. Nutrition on TPN  10. DM-2 on lantus and SSI  Pt. Remains critically ill with multi-organ failure and high risk for Cardioresp. Death. Prognosis remains poor  CODE STATUS:DNR  TOTAL Critical Care TIME TAKING CARE OF THIS PATIENT: 35 min.    Quianna Avery M.D on 02/10/2016 at 3:32 PM  Between 7am to 6pm - Pager - [phone]  After 6pm: House Pager: - [phone]  [facility]  Office  [phone]  CC: Primary care physician; Clydie Braun, MD

## 2016-02-10 NOTE — Progress Notes (Signed)
Initial Nutrition Assessment     INTERVENTION:  PN: Continue TPN at 8040ml/hr at this time, reassess titration in am. Pharmacy supplementing electrolytes   NUTRITION DIAGNOSIS:   Inadequate oral intake related to acute illness as evidenced by NPO status.    GOAL:   Provide needs based on ASPEN/SCCM guidelines    MONITOR:   Vent status, Labs, I & O's, Weight trends (Energy Intake)  REASON FOR ASSESSMENT:   Ventilator    ASSESSMENT:    Pt continues on CRRT, transitioning off epinehrine, levo added and possibly adding vasopressin if needed.    Current Nutrition: Tube feeding stopped and OG hooked to suction, minimal output per RN, Fleet Contrasachel.    Gastrointestinal Profile:abdomen with faint bowel sounds, abdomen soft but distended Last BM: 3/14   Scheduled Medications:  . alteplase  2 mg Intracatheter Once  . antiseptic oral rinse  7 mL Mouth Rinse Q2H  . chlorhexidine gluconate  15 mL Mouth Rinse BID  . diazepam  5 mg Intravenous 4 times per day  . hydrocortisone sod succinate (SOLU-CORTEF) inj  50 mg Intravenous Q6H  . lactulose  30 g Oral TID  . pantoprazole (PROTONIX) IV  40 mg Intravenous Daily  . piperacillin-tazobactam (ZOSYN)  IV  3.375 g Intravenous 3 times per day  . sodium chloride flush  10-40 mL Intracatheter Q12H  . sodium phosphate  Dextrose 5% IVPB  20 mmol Intravenous Once  . thiamine IV  100 mg Intravenous Daily    Continuous Medications:  . Marland Kitchen.TPN (CLINIMIX-E) Adult 40 mL/hr at 02/09/16 1752  . epinephrine 9 mcg/min (02/10/16 1015)  . fentaNYL Stopped (02/09/16 1305)  . insulin (NOVOLIN-R) infusion    . norepinephrine (LEVOPHED) Adult infusion 20 mcg/min (02/10/16 1015)  . propofol (DIPRIVAN) infusion 15 mcg/kg/min (02/10/16 0335)  . pureflow 2,000 mL/hr at 02/10/16 0259  . vasopressin (PITRESSIN) infusion - *FOR SHOCK*       Electrolyte/Renal Profile and Glucose Profile:   Recent Labs Lab 02/08/16 0400  02/09/16 0446  02/09/16 1122   02/09/16 1826  02/10/16 0319 02/10/16 0430 02/10/16 0635  NA 130*  --  132*  --   --   --   --   --   --  131*  --   K 4.1  --  4.0  --   --   --   --   --   --  4.1  --   CL 100*  --  102  --   --   --   --   --   --  102  --   CO2 25  --  26  --   --   --   --   --   --  24  --   BUN 23*  --  26*  --   --   --   --   --   --  31*  --   CREATININE 1.60*  --  1.27*  --   --   --   --   --   --  1.20  --   CALCIUM 7.4*  --  7.7*  --   --   --   --   --   --  7.6*  --   MG 1.7  < >  --   < > 1.6*  < > 1.6*  < > 1.7 1.6* 1.7  PHOS 1.4*  --  1.3*  --  1.2*  --  1.8*  --   --  2.2*  --   GLUCOSE 271*  --  269*  --   --   --   --   --   --  354*  --   < > = values in this interval not displayed. Protein Profile:  Recent Labs Lab 02/06/16 0333 02/07/16 0449 02/09/16 0446  ALBUMIN 2.7* 2.4* 2.2*        Weight Trend since Admission: Filed Weights   02/08/16 0500 02/09/16 0453 02/10/16 0426  Weight: 211 lb 13.8 oz (96.1 kg) 203 lb 4.2 oz (92.2 kg) 195 lb 5.2 oz (88.6 kg)      Diet Order:  .TPN (CLINIMIX-E) Adult  Skin:  Reviewed, no issues   Height:   Ht Readings from Last 1 Encounters:  02/17/2016  (1.702 m)    Weight:   Wt Readings from Last 1 Encounters:  02/10/16 195 lb 5.2 oz (88.6 kg)    Ideal Body Weight:     BMI:  Body mass index is 30.59 kg/(m^2).  Estimated Nutritional Needs:   Kcal:  1794 kcals (Ve: 12.5, Tmax: 36.4)   Protein:  131-174 g (1.5-2.0 g/kg)   Fluid:  2175-2610 mL (25-30 ml/kg)   EDUCATION NEEDS:      HIGH Care Level  Zaiyah Sottile B. Freida Busman, RD, LDN [phone] (pager) Weekend/On-Call pager [phone])

## 2016-02-10 NOTE — Progress Notes (Addendum)
CRRT restarted. Blood flow rate was decreased to 310, pt's access pressure could not tolerate 350 blood flow rate

## 2016-02-10 NOTE — Progress Notes (Signed)
PULMONARY / CRITICAL CARE MEDICINE   Name: Jay Mejia MRN: [id number] DOB: [birth year]    ADMISSION DATE:  01/25/2016    PT PROFILE:   42 M with alcoholic cirrhosis with active drinking, esophageal varices admitted 03/14 with pancreatitis, AKI, metabolic acidosis, hyperkalemia. Suffered asystolic cardiac arrest and underwent 4-5 mins of CPR including chest compressions and HCO3 with restoration of spontaneous circulation. He was intubated in course of ACLS. Course complicated by DIC, ARDS, severe shock. Currently on epi drip.    MAJOR EVENTS/TEST RESULTS: 03/14 Abd [place]: Gallstones and sludge in the gallbladder. There is gallbladder wall thickening. The sonographer does not report a positive sonographic Murphy sign. The findings are equivocal for cholecystitis. Diffuse increased echotexture of the liver, nonspecific but can be seen in fatty infiltration of liver. There is a nodular contour for of the liver surface suggesting changes of cirrhosis  03/15 CT head: NAICP 03/15 TTE: LVEF 50-55% 03/15 refractory shock despite DA, NE, VP and epinephrine infusions 03/15 CRRT initiated 03/15 refractory hypoxemia despite PEEP 18-20 cm H2O and FiO2 100% 03/16 Severe hypoxemia, refractory shock, severe acidosis, anuric renal failure persist 03/17 Improving gas exchange. Able to F/C on WUA. Remains on max vasopressors. Remains on CRRT with anuria. Acidosis improving. HCO3 gtt stopped. Severe abdominal distention - TPN initiated 3/20 Attempted to wean from epi drip to dopamine, became tachycardic and hypertensive with resp distress, switched back to epi.  3/21 will attempt changing vasopressors, CONT CRRT  INDWELLING DEVICES:: ETT 03/15 >>  L femoral HD cath 03/15 >>  R [place] CVL 05/15 >>  R femoral A-line 03/15 >>   MICRO DATA: MRSA PCR 03/15 >> NEG Resp 03/15 >> mod GPC chains >>  Blood 03/15 >>   ANTIMICROBIALS:  Pip-tazo 03/15 >>    SUBJECTIVE:  Patient sedated,intubated Remains on  vasopressors, on CRRT, remains critical  VITAL SIGNS: BP 110/64 mmHg  Pulse 70  Temp(Src) 98.1 F (36.7 C) (Axillary)  Resp 35  Ht  (1.702 m)  Wt 195 lb 5.2 oz (88.6 kg)  BMI 30.59 kg/m2  SpO2 94%  HEMODYNAMICS:    VENTILATOR SETTINGS: Vent Mode:  [-] PRVC FiO2 (%):  [40 %-50 %] 40 % Set Rate:  [35 bmp] 35 bmp Vt Set:  [400 mL] 400 mL PEEP:  [10 cmH20] 10 cmH20 Plateau Pressure:  [29 cmH20-30 cmH20] 29 cmH20  INTAKE / OUTPUT: I/O last 3 completed shifts: In: 3909.8 [I.V.:2037.9; NG/GT:202.3; IV Piggyback:765] Out: 7913 [Urine:30; Emesis/NG output:500; Other:7383]  PHYSICAL EXAMINATION: General: sedated, intubated, intermittent periods of agitation  Neuro: PERRL, MAEs HEENT: NCAT Cardiovascular: distant HS, regular, no M noted Lungs: diffuse bilateral dependent crackles Abdomen: distended, BS absent, no rebound Ext: warm, absent distal pulses Skin: multiple telangectasias  LABS:  BMET  Recent Labs Lab 02/08/16 0400 02/09/16 0446 02/10/16 0430  NA 130* 132* 131*  K 4.1 4.0 4.1  CL 100* 102 102  CO2 BUN 23* 26* 31*  CREATININE 1.60* 1.27* 1.20  GLUCOSE 271* 269* 354*    Electrolytes  Recent Labs Lab 02/08/16 0400  02/09/16 0446  02/09/16 1122  02/09/16 1826  02/10/16 0319 02/10/16 0430 02/10/16 0635  CALCIUM 7.4*  --  7.7*  --   --   --   --   --   --  7.6*  --   MG 1.7  < >  --   < > 1.6*  < > 1.6*  < > 1.7 1.6* 1.7  PHOS 1.4*  --  1.3*  --  1.2*  --  1.8*  --   --  2.2*  --   < > = values in this interval not displayed.  CBC  Recent Labs Lab 02/07/16 0449 02/07/16 0532 02/09/16 0446 02/10/16 0430  WBC 25.7*  --  19.9* 17.4*  HGB 8.8*  --  9.5* 10.4*  HCT 26.7*  --  28.0* 30.8*  PLT 20* 19* 14* 15*    Coag's  Recent Labs Lab 02/05/16 1049 02/06/16 0333 02/07/16 0532  APTT 88* 59* 49*  INR 5.61* 5.48* 2.92    Sepsis Markers  Recent Labs Lab 02/04/16 1044 02/04/16 1250 02/05/16 1049 02/06/16 0333  02/07/16 0449  LATICACIDVEN 6.6* 7.8*  --   --   --   PROCALCITON  --   --  0.96 1.82 2.58    ABG  Recent Labs Lab 02/08/16 0900 02/09/16 1051 02/10/16 0435  PHART 7.38 7.38 7.37  PCO2ART 38 39 38  PO2ART 110* 64* 67*    Liver Enzymes  Recent Labs Lab 02/06/16 0333 02/07/16 0449 02/09/16 0446  AST 2208* 2466* 443*  ALT 305* 577* 318*  ALKPHOS 130* 205* 316*  BILITOT 2.5* 5.7* 9.3*  ALBUMIN 2.7* 2.4* 2.2*    Cardiac Enzymes  Recent Labs Lab 02/04/16 0905 02/04/16 1447 02/04/16 2018  TROPONINI 0.10* 0.37* 0.84*    Glucose  Recent Labs Lab 02/09/16 1317 02/09/16 1622 02/09/16 1932 02/10/16 0105 02/10/16 0347 02/10/16 0718  GLUCAP 253* 273* 294* 334* 291* 287*    CXR: slightly improved edema /ARDS pattern   DISCUSSION: Principal Problem:   Pancreatitis, acute Active Problems:   Acute renal failure (HCC)   Hyperkalemia   Lactic acidosis   Acute respiratory failure with hypoxia (HCC)   ARDS (adult respiratory distress syndrome) (HCC)   Refractory shock (HCC)     [age] M with alcoholic cirrhosis with active drinking, esophageal varices admitted 03/14 with pancreatitis, AKI, metabolic acidosis, hyperkalemia. Suffered asystolic cardiac arrest and underwent 4-5 mins of CPR including chest compressions and HCO3 with restoration of spontaneous circulation. He was intubated in course of ACLS. Course complicated by DIC, ARDS, severe shock. Currently on vasopressors and CRRT Complicated by DT's  ASSESSMENT / PLAN:  PULMONARY A: Acute resp failure with hypoxia ARDS Severe hypoxemia - improving P:   Cont full vent support - settings reviewed and/or adjusted Will decrease PEEP from 10 to 8 as tolerated  may need to increase Fi02.  Daily SBT if/when meets criteria, currently not appropriate for weaning, still has high pressor and vent requirements.  Cont ARDS strategy -    CARDIOVASCULAR A:  Refractory shock - likely septic/SIRS P:  MAP goal > 65  mmHg Change  vasopressors to include NE/vasopressin, did not tolerate changing to dopamine Cont empiric hydrocortisone for possible relative adrenal insufficiency  RENAL A:   AKI Lactic acidosis, improving Hyperkalemia, resolved Hypokalemia Severe hypervolemia Hyponatremia Metabolic Acidosis - lactic - improving.  P:   Monitor BMET intermittently Monitor I/Os Correct electrolytes as indicated DC HCO3 gtt 03/17 CRRT per Renal service, try to achieve -50cc/hr if BP will permit.    GASTROINTESTINAL A:   Acute pancreatitis, likely alcoholic Possible cholecystitis Cirrhotic changes on abd US Severe abdominal distention with ileus - too unstable presently for CT abd/pelvis Patient was noncompliant outpatient with the Xifaxin and lactulose. P:   SUP: IV PPI TPN initiated 03/17 If becomes more stable, would obtain CTAP lipase trend 143>112 Last drink per wife (3/11), in the window for DTs,  will monitor closely, prn versed for sedation can also be used for DTs if needed Will add IV valium scheduled  HEMATOLOGIC A:   Acute blood loss anemia Thrombocytopenia, due to DIC, cirrhosis, critical illness. Severe DIC P:  DVT px: SCDs Monitor CBC intermittently Transfuse per usual guidelines -   - one unit PRBCs on 3/17 Avoid platelets and FFP in setting of DIC unless major bleeding   INFECTIOUS A:   Severe sepsis/SIRS Pancreatitis Possible cholecystitis Mildly elevated PCT P:   Monitor temp, WBC count Micro and abx as above  ENDOCRINE A:   DM2 - marginal control Presumed relative adrenal insufficiency P:   SSI, mod scale q 4 hrs Cont empiric hydrocortisone Lantus initiated 03/17 - adjusted based on requirements.   NEUROLOGIC A:   Acute encephalopathy  ICU/vent associated discomfort P:   RASS goal: -2 PAD protocol  I have personally obtained a history, examined the patient, evaluated Pertinent laboratory and RadioGraphic/imaging results, and  formulated the  assessment and plan   The Patient requires high complexity decision making for assessment and support, frequent evaluation and titration of therapies, application of advanced monitoring technologies and extensive interpretation of multiple databases. Critical Care Time devoted to patient care services described in this note is 45 minutes.   Overall, patient is critically ill, prognosis is guarded.  Patient with Multiorgan failure and at high risk for cardiac arrest and death.  Patient with multiorgan failure, prognosis is very poor. Patient is DNR   Lucie Leather, M.D.  [facility]  Medical Director [facility] Medical Director [facility] Cardio-Pulmonary Department

## 2016-02-10 NOTE — Progress Notes (Signed)
Dr. Belia HemanKasa requested that patient be only on epinephrine based off patient response to medication and decrease in fluid intake.

## 2016-02-10 NOTE — Progress Notes (Signed)
Propofol turned off for wake up assessment.  Patient moves all extremities equally but not on command, moves head back and forth, work of breathing increased.  Family at bedside and attempted to get patient to follow commands in [place] and swedish. Unfortunately patient did not follow commands.  Dr. Belia HemanKasa brought to bedside and witnessed patient behavior off sedation and had family meeting with wife and two daughters.

## 2016-02-10 NOTE — Progress Notes (Signed)
PHARMACY - CRITICAL CARE PROGRESS NOTE  Pharmacy Consult for CRRT Medication Adjustment   No Known Allergies  Patient Measurements: Height: 5\' 7"  (170.2 cm) Weight: 195 lb 5.2 oz (88.6 kg) IBW/kg (Calculated) : 66.1  Vital Signs: Temp: 98.1 F (36.7 C) (03/21 0731) Temp Source: Axillary (03/21 0731) Pulse Rate: 87 (03/21 1142) Intake/Output from previous day: 03/20 0701 - 03/21 0700 In: 3280.3 [I.V.:1508.4; NG/GT:202.3; IV Piggyback:665; TPN:904.7] Out: 5472 [Urine:30; Emesis/NG output:500] Intake/Output from this shift: Total I/O In: 280.1 [I.V.:179.4; TPN:100.7] Out: 508 [Other:508] Vent settings for last 24 hours: Vent Mode:  [-] PRVC FiO2 (%):  [40 %-50 %] 40 % Set Rate:  [35 bmp] 35 bmp Vt Set:  [400 mL] 400 mL PEEP:  [8 cmH20-10 cmH20] 8 cmH20 Plateau Pressure:  [29 cmH20] 29 cmH20  Labs:  Recent Labs  02/08/16 0400  02/09/16 0446  02/09/16 1122  02/09/16 1826  02/10/16 0319 02/10/16 0430 02/10/16 0635  WBC  --   --  19.9*  --   --   --   --   --   --  17.4*  --   HGB  --   --  9.5*  --   --   --   --   --   --  10.4*  --   HCT  --   --  28.0*  --   --   --   --   --   --  30.8*  --   PLT  --   --  14*  --   --   --   --   --   --  15*  --   CREATININE 1.60*  --  1.27*  --   --   --   --   --   --  1.20  --   MG 1.7  < >  --   < > 1.6*  < > 1.6*  < > 1.7 1.6* 1.7  PHOS 1.4*  --  1.3*  --  1.2*  --  1.8*  --   --  2.2*  --   ALBUMIN  --   --  2.2*  --   --   --   --   --   --   --   --   PROT  --   --  5.3*  --   --   --   --   --   --   --   --   AST  --   --  443*  --   --   --   --   --   --   --   --   ALT  --   --  318*  --   --   --   --   --   --   --   --   ALKPHOS  --   --  316*  --   --   --   --   --   --   --   --   BILITOT  --   --  9.3*  --   --   --   --   --   --   --   --   < > = values in this interval not displayed. Estimated Creatinine Clearance: 64.3 mL/min (by C-G formula based on Cr of 1.2).   Recent Labs  02/10/16 0105  02/10/16 0347 02/10/16 0718  GLUCAP 334* 291*  287*    Microbiology: Recent Results (from the past 720 hour(s))  MRSA PCR Screening     Status: None   Collection Time: 02/04/16  8:39 AM  Result Value Ref Range Status   MRSA by PCR NEGATIVE NEGATIVE Final    Comment:        The GeneXpert MRSA Assay (FDA approved for NASAL specimens only), is one component of a comprehensive MRSA colonization surveillance program. It is not intended to diagnose MRSA infection nor to guide or monitor treatment for MRSA infections.   Culture, blood (Routine X 2) w Reflex to ID Panel     Status: None   Collection Time: 02/04/16 12:34 PM  Result Value Ref Range Status   Specimen Description BLOOD RIGHT ARM  Final   Special Requests   Final    BOTTLES DRAWN AEROBIC AND ANAEROBIC AER ANA   Culture NO GROWTH 5 DAYS  Final   Report Status 02/09/2016 FINAL  Final  Culture, blood (Routine X 2) w Reflex to ID Panel     Status: None   Collection Time: 02/04/16 12:49 PM  Result Value Ref Range Status   Specimen Description BLOOD RIGHT HAND  Final   Special Requests   Final    BOTTLES DRAWN AEROBIC AND ANAEROBIC AER ANA   Culture NO GROWTH 5 DAYS  Final   Report Status 02/09/2016 FINAL  Final  Culture, respiratory (NON-Expectorated)     Status: None   Collection Time: 02/04/16  2:50 PM  Result Value Ref Range Status   Specimen Description TRACHEAL ASPIRATE  Final   Special Requests NONE  Final   Gram Stain   Final    MODERATE GRAM POSITIVE COCCI IN CHAINS MODERATE RED BLOOD CELLS RARE WBC SEEN    Culture   Final    LIGHT GROWTH KLEBSIELLA PNEUMONIAE MODERATE GROWTH HAEMOPHILUS PARAINFLUENZAE BETA LACTAMASE POSITIVE    Report Status 02/08/2016 FINAL  Final   Organism ID, Bacteria KLEBSIELLA PNEUMONIAE  Final      Susceptibility   Klebsiella pneumoniae - MIC*    AMPICILLIN >=32 RESISTANT Resistant     CEFAZOLIN <=4 SENSITIVE Sensitive     CEFEPIME <=1 SENSITIVE Sensitive      CEFTAZIDIME <=1 SENSITIVE Sensitive     CEFTRIAXONE <=1 SENSITIVE Sensitive     CIPROFLOXACIN <=0.25 SENSITIVE Sensitive     GENTAMICIN <=1 SENSITIVE Sensitive     IMIPENEM <=0.25 SENSITIVE Sensitive     TRIMETH/SULFA <=20 SENSITIVE Sensitive     AMPICILLIN/SULBACTAM 4 SENSITIVE Sensitive     PIP/TAZO <=4 SENSITIVE Sensitive     Extended ESBL NEGATIVE Sensitive     * LIGHT GROWTH KLEBSIELLA PNEUMONIAE    Medications:  Scheduled:  . alteplase  2 mg Intracatheter Once  . antiseptic oral rinse  7 mL Mouth Rinse Q2H  . chlorhexidine gluconate  15 mL Mouth Rinse BID  . diazepam  5 mg Intravenous 4 times per day  . hydrocortisone sod succinate (SOLU-CORTEF) inj  50 mg Intravenous Q6H  . lactulose  30 g Oral TID  . pantoprazole (PROTONIX) IV  40 mg Intravenous Daily  . piperacillin-tazobactam (ZOSYN)  IV  3.375 g Intravenous 3 times per day  . sodium chloride flush  10-40 mL Intracatheter Q12H  . sodium phosphate  Dextrose 5% IVPB  20 mmol Intravenous Once  . thiamine IV  100 mg Intravenous Daily   Infusions:  . Marland KitchenTPN (CLINIMIX-E) Adult 40 mL/hr at 02/09/16 1752  . epinephrine 2  mcg/min (02/10/16 1050)  . fentaNYL Stopped (02/09/16 1305)  . insulin (NOVOLIN-R) infusion    . norepinephrine (LEVOPHED) Adult infusion 30 mcg/min (02/10/16 1040)  . propofol (DIPRIVAN) infusion 15 mcg/kg/min (02/10/16 0335)  . pureflow 2,000 mL/hr at 02/10/16 0259  . vasopressin (PITRESSIN) infusion - *FOR SHOCK*      Assessment: Pharmacy consulted to assist in adjusting medications for CRRT in this 65 y/o M admitted with ARF and severe pancreatitis.   Plan:  No medications require adjustment for CRRT at present.   Pharmacy will continue to monitor and adjust per consult.   Luisa Hart D 02/10/2016,11:55 AM

## 2016-02-10 NOTE — Progress Notes (Signed)
Arterial line in trialysis catheter "clotted" off CRRT had to be stopped.  Line would flush but would not pull back any blood.  Alteplase was ordered prior to today.  I paged Dr. Cherylann RatelLateef to verify that it was still ok to infuse in port with the current platelet results.  Per Dr. Cherylann RatelLateef, ok to use as long as it stays in the line.

## 2016-02-10 NOTE — Progress Notes (Signed)
Critical lab value was called.  Platelets at 15.  Paged elink to make them aware.  Will continue to monitor.

## 2016-02-10 NOTE — Progress Notes (Signed)
eLink Physician-Brief Progress Note Patient Name: Jay BalloonJanerik B Mejia DOB: 07/20/1951 MRN: [id number]   Date of Service  02/10/2016  HPI/Events of Note  Notified by bedside nurse of multisystem organ failure currently on CVVHD and ventilator. Currently in DIC. Tube feed residuals greater than 300 mL.  eICU Interventions  1. Discontinue tube feedings 2. Gastric tube to low intermittent suction     Intervention Category Major Interventions: Other:  Lawanda CousinsJennings Shawnmichael Parenteau 02/10/2016, 12:43 AM

## 2016-02-10 NOTE — Progress Notes (Signed)
PARENTERAL NUTRITION CONSULT NOTE - INITIAL  Pharmacy Consult for TPN Electrolyte and Glucose Monitoring  No Known Allergies  Patient Measurements: Height:  (170.2 cm) Weight: 195 lb 5.2 oz (88.6 kg) IBW/kg (Calculated) : 66.1  Vital Signs: Temp: 98.1 F (36.7 C) (03/21 0731) Temp Source: Axillary (03/21 0731) Pulse Rate: 87 (03/21 1142) Intake/Output from previous day: 03/20 0701 - 03/21 0700 In: 3280.3 [I.V.:1508.4; NG/GT:202.3; IV Piggyback:665; TPN:904.7] Out: 5472 [Urine:30; Emesis/NG output:500] Intake/Output from this shift: Total I/O In: 280.1 [I.V.:179.4; TPN:100.7] Out: 508 [Other:508]  Labs:  Recent Labs  02/09/16 0446 02/10/16 0430  WBC 19.9* 17.4*  HGB 9.5* 10.4*  HCT 28.0* 30.8*  PLT 14* 15*     Recent Labs  02/08/16 0400  02/09/16 0446  02/09/16 1122  02/09/16 1826  02/10/16 0319 02/10/16 0430 02/10/16 0635  NA 130*  --  132*  --   --   --   --   --   --  131*  --   K 4.1  --  4.0  --   --   --   --   --   --  4.1  --   CL 100*  --  102  --   --   --   --   --   --  102  --   CO2 25  --  26  --   --   --   --   --   --  24  --   GLUCOSE 271*  --  269*  --   --   --   --   --   --  354*  --   BUN 23*  --  26*  --   --   --   --   --   --  31*  --   CREATININE 1.60*  --  1.27*  --   --   --   --   --   --  1.20  --   CALCIUM 7.4*  --  7.7*  --   --   --   --   --   --  7.6*  --   MG 1.7  < >  --   < > 1.6*  < > 1.6*  < > 1.7 1.6* 1.7  PHOS 1.4*  --  1.3*  --  1.2*  --  1.8*  --   --  2.2*  --   PROT  --   --  5.3*  --   --   --   --   --   --   --   --   ALBUMIN  --   --  2.2*  --   --   --   --   --   --   --   --   AST  --   --  443*  --   --   --   --   --   --   --   --   ALT  --   --  318*  --   --   --   --   --   --   --   --   ALKPHOS  --   --  316*  --   --   --   --   --   --   --   --   BILITOT  --   --  9.3*  --   --   --   --   --   --   --   --  TRIG  --   --   --   --  107  --   --   --   --   --   --   < > = values in  this interval not displayed. Estimated Creatinine Clearance: 64.3 mL/min (by C-G formula based on Cr of 1.2).    Recent Labs  02/10/16 0105 02/10/16 0347 02/10/16 0718  GLUCAP 334* 291* 287*    Medical History: Past Medical History  Diagnosis Date  . Hypertension   . Diabetes mellitus without complication (HCC)   . 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 alteplase  2 mg Intracatheter Once  . antiseptic oral rinse  7 mL Mouth Rinse Q2H  . chlorhexidine gluconate  15 mL Mouth Rinse BID  . diazepam  5 mg Intravenous 4 times per day  . hydrocortisone sod succinate (SOLU-CORTEF) inj  50 mg Intravenous Q6H  . lactulose  30 g Oral TID  . pantoprazole (PROTONIX) IV  40 mg Intravenous Daily  . piperacillin-tazobactam (ZOSYN)  IV  3.375 g Intravenous 3 times per day  . sodium chloride flush  10-40 mL Intracatheter Q12H  . sodium phosphate  Dextrose 5% IVPB  20 mmol Intravenous Once  . thiamine IV  100 mg Intravenous Daily   Infusions:  . Marland Kitchen.TPN (CLINIMIX-E) Adult 40 mL/hr at 02/09/16 1752  . epinephrine 2 mcg/min (02/10/16 1050)  . fentaNYL Stopped (02/09/16 1305)  . insulin (NOVOLIN-R) infusion    . norepinephrine (LEVOPHED) Adult infusion 30 mcg/min (02/10/16 1040)  . propofol (DIPRIVAN) infusion 15 mcg/kg/min (02/10/16 0335)  . pureflow 2,000 mL/hr at 02/10/16 0259  . vasopressin (PITRESSIN) infusion - *FOR SHOCK*      Insulin Requirements in the past 24 hours:  48 units SSI   Current Nutrition:  Clinimix 5/20 at 40 ml/hr   Assessment: 65 y/o M admitted with severe pancreatitis and renal failure on CRRT with orders to begin TPN.    Plan:  Patient is ordered SSI and Lantus 20 units daily. Transitioning to insulin drip due to persistent hyperglycemia.  K and Mg are WNL. Will replace phosporus with Naphos 20 mmol iv once and recheck electrolytes in AM.    Valentina Guhristy, Eily Louvier D, RPh Clinical Pharmacist 02/10/2016,11:50 AM

## 2016-02-10 NOTE — Progress Notes (Signed)
Dr. Belia HemanKasa rounded on patient and requested that nurse attempt to wean off epinephrine drip and start levophed drip.  Dr. Belia HemanKasa also made aware of patient not tolerating tube feeds, has had no BM since 01/21/2016, and patient continues with high blood glucose levels.  Orders received.

## 2016-02-10 NOTE — Progress Notes (Signed)
Pt began having secretions to increase in amount coming from his mouth.  Bubbly in appearance.  Pt was suctioned, tolerated well, but secretions returned and they were brown in color.  Also, a brown color of secretions started coming more from the epiglottic suction.  Respiratory was made aware and air was added to the cuff by respiratory.  The OG tube, which was infusing tube feeds,  began to have a more yellow appearance.  Tube feeds were held and I checked to see if pt was tolerating tube feeds.  Bowel sounds were assessed and I did hear faint, hypoactive bowel sounds.  Belly was still distended but appeared to be more taut.  I checked residual and what appeared to be tube feeds came out at first, but then turned to a brown color.  Elink was paged and information was relayed to nurse who in turn was to relay information to Dr. Jamison NeighborNestor.  Will continue to monitor.

## 2016-02-10 NOTE — Progress Notes (Signed)
Nurse started and titrated levophed up to maximum dose and epinephrine down from 18 mcg to 2mcg.  Per Dr. Clovis FredricksonKasa's request epinephrine drip stopped and vasopressin started.  Patient's blood pressure dropped into 80' systolic with map below 60.  Vasopressin stopped and Epinephrine drip restarted. Dr. Belia HemanKasa made aware.  Nurse awaiting further orders.

## 2016-02-10 NOTE — Progress Notes (Signed)
Pharmacy Antibiotic Note  Jay Mejia is a 65 y.o. male with acute renal failure requiring CRRT admitted on 11/02/2016 with severe pancreatitis.  Pharmacy has been consulted for Zosyn dosing.  PCT= 1.82  Plan: Continue Zosyn 3.375g IV q8h (4 hour infusion).  Height: 5\' 7"  (170.2 cm) Weight: 195 lb 5.2 oz (88.6 kg) IBW/kg (Calculated) : 66.1  Temp (24hrs), Avg:97.8 F (36.6 C), Min:97.6 F (36.4 C), Max:98.1 F (36.7 C)   Recent Labs Lab 02/04/16 1044  02/04/16 1250  02/05/16 0237  02/06/16 0333  02/07/16 0449  02/07/16 1831 02/07/16 2356 02/08/16 0400 02/09/16 0446 02/10/16 0430  WBC  --   --   --   < > 7.4  --  14.2*  --  25.7*  --   --   --   --  19.9* 17.4*  CREATININE 5.81*  < >  --   < > 3.94*  < > 2.70*  2.79*  < > 1.85*  < > 1.49* 1.40* 1.60* 1.27* 1.20  LATICACIDVEN 6.6*  --  7.8*  --   --   --   --   --   --   --   --   --   --   --   --   < > = values in this interval not displayed.  Estimated Creatinine Clearance: 64.3 mL/min (by C-G formula based on Cr of 1.2).    No Known Allergies  Antimicrobials this admission: Zosyn 3/15 >>    Dose adjustments this admission:   Microbiology results: 3/15 BCx: negative 3/15 TA: Klebsiella pneumoniae 3/15 MRSA PCR: negative  Thank you for allowing pharmacy to be a part of this patient's care.  Jay Mejia, Jay Mejia D 02/10/2016 11:54 AM

## 2016-02-11 LAB — GLUCOSE, CAPILLARY
GLUCOSE-CAPILLARY: 138 mg/dL — AB (ref 65–99)
GLUCOSE-CAPILLARY: 174 mg/dL — AB (ref 65–99)
GLUCOSE-CAPILLARY: 183 mg/dL — AB (ref 65–99)
GLUCOSE-CAPILLARY: 268 mg/dL — AB (ref 65–99)
GLUCOSE-CAPILLARY: 271 mg/dL — AB (ref 65–99)
GLUCOSE-CAPILLARY: 272 mg/dL — AB (ref 65–99)
GLUCOSE-CAPILLARY: 324 mg/dL — AB (ref 65–99)
Glucose-Capillary: 240 mg/dL — ABNORMAL HIGH (ref 65–99)
Glucose-Capillary: 231 mg/dL — ABNORMAL HIGH (ref 65–99)
Glucose-Capillary: 201 mg/dL — ABNORMAL HIGH (ref 65–99)
Glucose-Capillary: 195 mg/dL — ABNORMAL HIGH (ref 65–99)
Glucose-Capillary: 150 mg/dL — ABNORMAL HIGH (ref 65–99)

## 2016-02-11 LAB — BASIC METABOLIC PANEL
ANION GAP: 8 (ref 5–15)
Anion gap: 11 (ref 5–15)
BUN: 33 mg/dL — AB (ref 6–20)
BUN: 36 mg/dL — ABNORMAL HIGH (ref 6–20)
CALCIUM: 8 mg/dL — AB (ref 8.9–10.3)
CALCIUM: 7.9 mg/dL — AB (ref 8.9–10.3)
CO2: 20 mmol/L — ABNORMAL LOW (ref 22–32)
CO2: 21 mmol/L — AB (ref 22–32)
CREATININE: 1.03 mg/dL (ref 0.61–1.24)
CREATININE: 1.35 mg/dL — AB (ref 0.61–1.24)
Chloride: 101 mmol/L (ref 101–111)
Chloride: 103 mmol/L (ref 101–111)
GFR calc Af Amer: >60 mL/min (ref >60)
GFR, EST NON AFRICAN AMERICAN: 53 mL/min — AB (ref >60)
GLUCOSE: 248 mg/dL — AB (ref 65–99)
Glucose, Bld: 187 mg/dL — ABNORMAL HIGH (ref 65–99)
POTASSIUM: 3.9 mmol/L (ref 3.5–5.1)
Potassium: 4.2 mmol/L (ref 3.5–5.1)
SODIUM: 132 mmol/L — AB (ref 135–145)
SODIUM: 132 mmol/L — AB (ref 135–145)

## 2016-02-11 LAB — PHOSPHORUS: PHOSPHORUS: UNDETERMINED mg/dL (ref 2.5–4.6)

## 2016-02-11 LAB — MAGNESIUM
MAGNESIUM: 1.6 mg/dL — AB (ref 1.7–2.4)
MAGNESIUM: 1.7 mg/dL (ref 1.7–2.4)
MAGNESIUM: 1.5 mg/dL — AB (ref 1.7–2.4)
Magnesium: 1.5 mg/dL — ABNORMAL LOW (ref 1.7–2.4)

## 2016-02-11 MED ORDER — MORPHINE SULFATE (PF) 2 MG/ML IV SOLN
2.0000 mg | INTRAVENOUS | Status: DC | PRN
  Administered 2016-02-11: 4 mg via INTRAVENOUS

## 2016-02-11 MED ORDER — MAGNESIUM SULFATE 2 GM/50ML IV SOLN
2.0000 g | Freq: Once | INTRAVENOUS | Status: DC
  Filled 2016-02-11: qty 50

## 2016-02-11 MED ORDER — M.V.I. ADULT IV INJ
INJECTION | INTRAVENOUS | Status: DC
  Filled 2016-02-11: qty 960

## 2016-02-11 MED ORDER — ALTEPLASE 2 MG IJ SOLR
2.0000 mg | Freq: Once | INTRAMUSCULAR | Status: AC
  Administered 2016-02-11: 2 mg
  Filled 2016-02-11: qty 2

## 2016-02-11 MED ORDER — ALTEPLASE 100 MG IV SOLR: 2.0000 mg | Freq: Once | INTRAVENOUS | Status: DC

## 2016-02-11 MED ORDER — MORPHINE SULFATE (PF) 4 MG/ML IV SOLN
INTRAVENOUS | Status: AC
  Administered 2016-02-11: 4 mg
  Filled 2016-02-11: qty 1

## 2016-02-11 MED ORDER — STERILE WATER FOR INJECTION IJ SOLN
INTRAMUSCULAR | Status: AC
Start: 2016-02-11 — End: 2016-02-11
  Administered 2016-02-11: 13:00:00
  Filled 2016-02-11: qty 10

## 2016-02-13 LAB — BLOOD GAS, ARTERIAL
ACID-BASE DEFICIT: 19.6 mmol/L — AB (ref 0.0–2.0)
ACID-BASE DEFICIT: 9.2 mmol/L — AB (ref 0.0–2.0)
ACID-BASE DEFICIT: 2.4 mmol/L — AB (ref 0.0–2.0)
Allens test (pass/fail): POSITIVE — AB
BICARBONATE: 11 meq/L — AB (ref 21.0–28.0)
BICARBONATE: 22.5 meq/L (ref 21.0–28.0)
Bicarbonate: 18.7 mEq/L — ABNORMAL LOW (ref 21.0–28.0)
FIO2: 100
FIO2: 0.9
FIO2: 0.4
LHR: 35 {breaths}/min
MECHVT: 400 mL
O2 SAT: 70.6 %
O2 SAT: 98.9 %
O2 Saturation: 98.2 %
PCO2 ART: 50 mmHg — AB (ref 32.0–48.0)
PEEP/CPAP: 16 cmH2O
PEEP: 20 cmH2O
PEEP: 20 cmH2O
PH ART: 6.96 — AB (ref 7.350–7.450)
PH ART: 7.18 — AB (ref 7.350–7.450)
PH ART: 7.38 (ref 7.350–7.450)
PO2 ART: 155 mmHg — AB (ref 83.0–108.0)
Patient temperature: 37
Patient temperature: 37
Patient temperature: 37
RATE: 35 resp/min
RATE: 35 resp/min
VT: 400 mL
VT: 400 mL
pCO2 arterial: 49 mmHg — ABNORMAL HIGH (ref 32.0–48.0)
pCO2 arterial: 38 mmHg (ref 32.0–48.0)
pO2, Arterial: 60 mmHg — ABNORMAL LOW (ref 83.0–108.0)
pO2, Arterial: 110 mmHg — ABNORMAL HIGH (ref 83.0–108.0)

## 2016-02-21 NOTE — Progress Notes (Signed)
Spoke with Jay Mejia ME to see if this was an ME case. [place] [facility] Not an ME case.

## 2016-02-21 NOTE — Progress Notes (Addendum)
Patient extubated per MD order by Lelon PerlaFredda, RT for comfort care measures. [address] on cardiac monitor, no heart tones auscultated by this RN or Charlena CrossBrooke Wean, Charity fundraiserN. Sympathy expressed to family members at bedside, [employer] release signed by patient wife. Central cardiac monitoring notified. Dr. Allegra Granaamaschandran notified of patient death. [facility] [employer] [facility] notified, not candidate for donation, release # [id number].

## 2016-02-21 NOTE — Progress Notes (Signed)
Spoke with wife, 2 daughters, son re: pts status. They are agreement on change of goals to comfort measures only and terminal extubation.Orders placed.

## 2016-02-21 NOTE — Discharge Summary (Signed)
  Date of admission 02/06/2016 Date of Death Oct 14, 2016 at 1600  Final Diagnosis  -Cardiorespiratory arrest due to severe Hyperkalemia -Complicated severe Pancreatitis -Severe Septic Shock -Acute renal failure -Shock Liver -Acute hypoxic respiratory failure due to ARDS -Severe Lactic Acidosis -Alcoholic cirrhosis of Liver  Patient was admitted with acute severe pancreatitis and was transferred to CCU after Cardiorespiratory arrest. Remained Intubated and required 3 IV pressors due to severe septic shock/ CRRT initiated due to acute renal failure. Pt has shock liver and developed ARDS. Pt remained critically ill and after no improvement in his condition discussion was made in the family and family decided to withdrawal care. Pt was terminally extubated and died on 2016/01/07 at 1600 hours.  Pt was DNR.

## 2016-02-21 NOTE — Progress Notes (Addendum)
Decreased blood flow rate to 300 on CRRT to help with blood pressure readings

## 2016-02-21 NOTE — Progress Notes (Signed)
Nutrition Follow-up      INTERVENTION:   PN: current TPN with diprivan does not meet nutritional needs; only provides 48 g of protein, 845 kcals, 960 mL.  Discussed nutritional poc during ICU rounds with MD Nicholos Johnsamachandran. Per MD, plan is for MD to discuss possible withdraw of care today with family. Due to poc, no change in TPN at present. Will reassess on follow   NUTRITION DIAGNOSIS:   Inadequate oral intake related to acute illness as evidenced by NPO status. Being addressed via TPN  GOAL:   Provide needs based on ASPEN/SCCM guidelines  MONITOR:   Other (Comment), Vent status, Labs, Weight trends, TF tolerance, Skin, I & O's (TPN tolerance)  REASON FOR ASSESSMENT:   Ventilator    ASSESSMENT:    Pt remains on vent, on CRRT UF at 175 ml/hr, pt now on levophed, vasopressin and epinephrine to maintain MAP of 65 or greater, AM labs pending  Diet Order:  .TPN (CLINIMIX-E) Adult .TPN (CLINIMIX-E) Adult   PN: 5%AA/20%Dextrose at 40 ml/hr infusing via right subclavian CVC  Digestive System: constipation continues, abdomen distended, NG to suction  Skin:  Reviewed, no issues  Last BM:  3/14    Recent Labs Lab 02/08/16 0400  02/09/16 0446  02/09/16 1122  02/09/16 1826  02/10/16 0430  02/10/16 2305 10/24/16 0231 10/24/16 0807  NA 130*  --  132*  --   --   --   --   --  131*  --   --   --   --   K 4.1  --  4.0  --   --   --   --   --  4.1  --   --   --   --   CL 100*  --  102  --   --   --   --   --  102  --   --   --   --   CO2 25  --  26  --   --   --   --   --  24  --   --   --   --   BUN 23*  --  26*  --   --   --   --   --  31*  --   --   --   --   CREATININE 1.60*  --  1.27*  --   --   --   --   --  1.20  --   --   --   --   CALCIUM 7.4*  --  7.7*  --   --   --   --   --  7.6*  --   --   --   --   MG 1.7  < >  --   < > 1.6*  < > 1.6*  < > 1.6*  < > 1.6* 1.7 1.5*  PHOS 1.4*  --  1.3*  --  1.2*  --  1.8*  --  2.2*  --   --   --   --   GLUCOSE 271*  --  269*  --    --   --   --   --  354*  --   --   --   --   < > = values in this interval not displayed.  Glucose Profile:  Recent Labs  10/24/16 0818 10/24/16 0934 10/24/16 1029  GLUCAP 174* 138* 150*   Meds:  insulin drip, pureflow solution 4K-3Ca-1Mg , diprivan 243 kcals in past 24  hours  Height:   Ht Readings from Last 1 Encounters:  02-09-2016  (1.702 m)    Weight:   Wt Readings from Last 1 Encounters:  01/24/2016 197 lb 1.5 oz (89.4 kg)    Filed Weights   02/09/16 0453 02/10/16 0426 01/24/2016 0439  Weight: 203 lb 4.2 oz (92.2 kg) 195 lb 5.2 oz (88.6 kg) 197 lb 1.5 oz (89.4 kg)    BMI:  Body mass index is 30.86 kg/(m^2).  Estimated Nutritional Needs:   Kcal:  1794 kcals (Ve: 12.5, Tmax: 36.4)   Protein:  131-174 g (1.5-2.0 g/kg)   Fluid:  2175-2610 mL (25-30 ml/kg)   EDUCATION NEEDS:   Education needs no appropriate at this time  HIGH Care Level  Romelle Starcher MS, RD, LDN [phone] Pager  [phone] Weekend/On-Call Pager

## 2016-02-21 NOTE — Progress Notes (Signed)
PARENTERAL NUTRITION CONSULT NOTE - INITIAL  Pharmacy Consult for TPN Electrolyte and Glucose Monitoring  No Known Allergies  Patient Measurements: Height: 5\' 7"  (170.2 cm) Weight: 197 lb 1.5 oz (89.4 kg) IBW/kg (Calculated) : 66.1  Vital Signs: Temp: 99.2 F (37.3 C) (03/22 1200) Temp Source: Oral (03/22 1200) BP: 85/62 mmHg (03/22 1200) Pulse Rate: 98 (03/22 1200) Intake/Output from previous day: 03/21 0701 - 03/22 0700 In: 3615.2 [I.V.:2419.8; IV Piggyback:150; TPN:1045.3] Out: 3656 [Emesis/NG output:100] Intake/Output from this shift: Total I/O In: 206 [I.V.:206] Out: 333 [Other:333]  Labs:  Recent Labs  02/09/16 0446 02/10/16 0430  WBC 19.9* 17.4*  HGB 9.5* 10.4*  HCT 28.0* 30.8*  PLT 14* 15*     Recent Labs  02/09/16 0446  02/09/16 1122  02/09/16 1826  02/10/16 0430  01/23/2016 0231 02/16/2016 0454 02/07/2016 0807 02/19/2016 1242  NA 132*  --   --   --   --   --  131*  --   --  132*  --   --   K 4.0  --   --   --   --   --  4.1  --   --  3.9  --   --   CL 102  --   --   --   --   --  102  --   --  101  --   --   CO2 26  --   --   --   --   --  24  --   --  20*  --   --   GLUCOSE 269*  --   --   --   --   --  354*  --   --  248*  --   --   BUN 26*  --   --   --   --   --  31*  --   --  33*  --   --   CREATININE 1.27*  --   --   --   --   --  1.20  --   --  1.03  --   --   CALCIUM 7.7*  --   --   --   --   --  7.6*  --   --  8.0*  --   --   MG  --   < > 1.6*  < > 1.6*  < > 1.6*  < > 1.7  --  1.5* 1.5*  PHOS 1.3*  --  1.2*  --  1.8*  --  2.2*  --   --  UNABLE TO REPORT DUE TO ICTERUS  --   --   PROT 5.3*  --   --   --   --   --   --   --   --   --   --   --   ALBUMIN 2.2*  --   --   --   --   --   --   --   --   --   --   --   AST 443*  --   --   --   --   --   --   --   --   --   --   --   ALT 318*  --   --   --   --   --   --   --   --   --   --   --  ALKPHOS 316*  --   --   --   --   --   --   --   --   --   --   --   BILITOT 9.3*  --   --   --   --   --    --   --   --   --   --   --   TRIG  --   --  107  --   --   --   --   --   --   --   --   --   < > = values in this interval not displayed. Estimated Creatinine Clearance: 75.2 mL/min (by C-G formula based on Cr of 1.03).    Recent Labs  01/26/2016 0934 01/30/2016 1029 01/25/2016 1132  GLUCAP 138* 150* 183*    Medical History: Past Medical History  Diagnosis Date  . Hypertension   . Diabetes mellitus without complication (HCC)   . 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 antiseptic oral rinse  7 mL Mouth Rinse Q2H  . chlorhexidine gluconate  15 mL Mouth Rinse BID  . diazepam  5 mg Intravenous 4 times per day  . hydrocortisone sod succinate (SOLU-CORTEF) inj  50 mg Intravenous Q6H  . lactulose  30 g Oral TID  . magnesium sulfate 1 - 4 g bolus IVPB  2 g Intravenous Once  . pantoprazole (PROTONIX) IV  40 mg Intravenous Daily  . piperacillin-tazobactam (ZOSYN)  IV  3.375 g Intravenous 3 times per day  . sodium chloride flush  10-40 mL Intracatheter Q12H  . thiamine IV  100 mg Intravenous Daily   Infusions:  . Marland KitchenTPN (CLINIMIX-E) Adult 40 mL/hr at 02/10/16 1816  . Marland KitchenTPN (CLINIMIX-E) Adult    . epinephrine 20 mcg/min (02/07/2016 1247)  . fentaNYL Stopped (02/09/16 1305)  . insulin (NOVOLIN-R) infusion 16.9 Units/hr (02/04/2016 0629)  . norepinephrine (LEVOPHED) Adult infusion 45 mcg/min (02/17/2016 1138)  . propofol (DIPRIVAN) infusion 8 mcg/kg/min (02/14/2016 1312)  . pureflow 2,000 mL/hr at 02/08/2016 0428  . vasopressin (PITRESSIN) infusion - *FOR SHOCK* 0.03 Units/min (02/06/2016 0857)    Insulin Requirements in the past 24 hours:  Patient is currently on insulin drip   Current Nutrition:  Clinimix 5/20 at 40 ml/hr   Assessment: 65 y/o M admitted with severe pancreatitis and renal failure on CRRT with orders to begin TPN.    Plan:  Patient continues on insulin drip. Multiple blood sugars less than 180 but MD wants to continue drip for  now. Will replace magnesium with magnesium sulfate 2 g iv once and f/u AM labs.     Valentina Gu, RPh Clinical Pharmacist 01/22/2016,1:30 PM

## 2016-02-21 NOTE — Progress Notes (Signed)
CDS called back. Determined MR.Claar was not a candidate for organ donation. Request call back with a time of cardiac death.

## 2016-02-21 NOTE — Progress Notes (Signed)
PULMONARY / CRITICAL CARE MEDICINE   Name: Jay Mejia MRN: [id number] DOB: [birth year]    ADMISSION DATE:  01/23/2016  83 M with alcoholic cirrhosis with active drinking, esophageal varices admitted 03/14 with pancreatitis, AKI, metabolic acidosis, hyperkalemia. Suffered asystolic cardiac arrest and underwent 4-5 mins of CPR including chest compressions and HCO3 with restoration of spontaneous circulation. He was intubated in course of ACLS. Course complicated by DIC, ARDS, severe shock. Currently on vasopressors and CRRT Complicated by DT's  ASSESSMENT / PLAN:  PULMONARY A: Acute resp failure with hypoxia ARDS improved, currently on PRVC, tidal volume 400/rate of 35/FiO2 40%/PEEP of 8.  P:   Cont full vent support - settings reviewed and/or adjusted Continue current vent settings, with low tidal volumes. the patient is not appropriate for weaning due to severe critical illness with refractory hypotension and severe metabolic encephalopathy with unresponsiveness.  I have discussed the patient's very poor prognosis with the family at bedside. I have explained the current refractory hypotension on max dose of  pressors, continued metabolic encephalopathy, and underlying liver cirrhosis. I recommended comfort measures only. They're going to consider this and will consider change to comfort measures. Later today or early tomorrow.   CARDIOVASCULAR A:  Refractory shock - likely septic/SIRS. Currently on maximum dose of IV epinephrine infusion, IV Levaquin, fentanyl infusion. The patient is also on a vasopressin infusion. P:  Continue current doses of pressors.  RENAL A:   AKI Lactic acidosis, improving Hyperkalemia, resolved Hypokalemia Severe hypervolemia Hyponatremia Metabolic Acidosis - lactic - improving.  P:   Discussed with nephrology, will continue CRRT for now, we are maxed out on pressors and this may be contributing to further hypotension.    GASTROINTESTINAL A:    Acute pancreatitis, likely alcoholic Possible cholecystitis Cirrhotic changes on abd [place] Severe abdominal distention with ileus - too unstable presently for CT abd/pelvis Patient was noncompliant outpatient with the Xifaxin and lactulose. P:   SUP: IV PPI TPN initiated 03/17 If becomes more stable, would obtain CTAP lipase trend 143>112 Last drink per wife (3/11), in the window for DTs, will monitor closely, prn versed for sedation can also be used for DTs if needed Will add IV valium scheduled  HEMATOLOGIC A:   Acute blood loss anemia Thrombocytopenia, due to DIC, cirrhosis, critical illness. Severe DIC P:  DVT px: SCDs Monitor CBC intermittently Transfuse per usual guidelines -   - one unit PRBCs on 3/17 Avoid platelets and FFP in setting of DIC unless major bleeding   INFECTIOUS A:   Severe sepsis/SIRS Pancreatitis Possible cholecystitis Mildly elevated PCT P:   Monitor temp, WBC count Micro and abx as above  ENDOCRINE A:   DM2 - marginal control Presumed relative adrenal insufficiency P:   SSI, mod scale q 4 hrs Cont empiric hydrocortisone Lantus initiated 03/17 - adjusted based on requirements.   NEUROLOGIC A:   Acute encephalopathy , metabolic. ICU/vent associated discomfort P:   RASS goal: -2 PAD protocol     MAJOR EVENTS/TEST RESULTS: 03/14 Abd [place]: Gallstones and sludge in the gallbladder. There is gallbladder wall thickening. The sonographer does not report a positive sonographic Murphy sign. The findings are equivocal for cholecystitis. Diffuse increased echotexture of the liver, nonspecific but can be seen in fatty infiltration of liver. There is a nodular contour for of the liver surface suggesting changes of cirrhosis  03/15 CT head: NAICP 03/15 TTE: LVEF 50-55% 03/15 refractory shock despite DA, NE, VP and epinephrine infusions 03/15 CRRT initiated 03/15 refractory  hypoxemia despite PEEP 18-20 cm H2O and FiO2 100% 03/16 Severe hypoxemia,  refractory shock, severe acidosis, anuric renal failure persist 03/17 Improving gas exchange. Able to F/C on WUA. Remains on max vasopressors. Remains on CRRT with anuria. Acidosis improving. HCO3 gtt stopped. Severe abdominal distention - TPN initiated 3/20 Attempted to wean from epi drip to dopamine, became tachycardic and hypertensive with resp distress, switched back to epi.  3/21 will attempt changing vasopressors, CONT CRRT  INDWELLING DEVICES:: ETT 03/15 >>  L femoral HD cath 03/15 >>  R [place] CVL 05/15 >>  R femoral A-line 03/15 >>   MICRO DATA: MRSA PCR 03/15 >> NEG Resp 03/15 >> mod GPC chains >>  Blood 03/15 >>   ANTIMICROBIALS:  Pip-tazo 03/15 >>    SUBJECTIVE:  Patient sedated,intubated Remains on vasopressors, on CRRT, remains critical  VITAL SIGNS: BP 85/62 mmHg  Pulse 98  Temp(Src) 99.2 F (37.3 C) (Oral)  Resp 37  Ht  (1.702 m)  Wt 197 lb 1.5 oz (89.4 kg)  BMI 30.86 kg/m2  SpO2 94%  HEMODYNAMICS:    VENTILATOR SETTINGS: Vent Mode:  [-] PRVC FiO2 (%):  [40 %] 40 % Set Rate:  [35 bmp] 35 bmp Vt Set:  [400 mL] 400 mL PEEP:  [8 cmH20] 8 cmH20  INTAKE / OUTPUT: I/O last 3 completed shifts: In: 5289.2 [I.V.:3189.6; NG/GT:90; IV Piggyback:505] Out: 7153 [Urine:30; Emesis/NG output:600; Other:6523]  PHYSICAL EXAMINATION: General: sedated, intubated, intermittent periods of agitation  Neuro: PERRL, MAEs HEENT: NCAT Cardiovascular: distant HS, regular, no M noted Lungs: diffuse bilateral dependent crackles Abdomen: distended, BS absent, no rebound Ext: warm, absent distal pulses Skin: multiple telangectasias  LABS:  BMET  Recent Labs Lab 02/10/16 0430 02/05/2016 0454 02/16/2016 1242  NA 131* 132* 132*  K 4.1 3.9 4.2  CL 102 101 103  CO2 24 20* 21*  BUN 31* 33* 36*  CREATININE 1.20 1.03 1.35*  GLUCOSE 354* 248* 187*    Electrolytes  Recent Labs Lab 02/09/16 1826  02/10/16 0430  02/16/2016 0231 01/22/2016 0454 02/15/2016 0807  01/29/2016 1242  CALCIUM  --   --  7.6*  --   --  8.0*  --  7.9*  MG 1.6*  < > 1.6*  < > 1.7  --  1.5* 1.5*  PHOS 1.8*  --  2.2*  --   --  UNABLE TO REPORT DUE TO ICTERUS  --   --   < > = values in this interval not displayed.  CBC  Recent Labs Lab 02/07/16 0449 02/07/16 0532 02/09/16 0446 02/10/16 0430  WBC 25.7*  --  19.9* 17.4*  HGB 8.8*  --  9.5* 10.4*  HCT 26.7*  --  28.0* 30.8*  PLT 20* 19* 14* 15*    Coag's  Recent Labs Lab 02/05/16 1049 02/06/16 0333 02/07/16 0532  APTT 88* 59* 49*  INR 5.61* 5.48* 2.92    Sepsis Markers  Recent Labs Lab 02/05/16 1049 02/06/16 0333 02/07/16 0449  PROCALCITON 0.96 1.82 2.58    ABG  Recent Labs Lab 02/08/16 0900 02/09/16 1051 02/10/16 0435  PHART 7.38 7.38 7.37  PCO2ART 38 39 38  PO2ART 110* 64* 67*    Liver Enzymes  Recent Labs Lab 02/06/16 0333 02/07/16 0449 02/09/16 0446  AST 2208* 2466* 443*  ALT 305* 577* 318*  ALKPHOS 130* 205* 316*  BILITOT 2.5* 5.7* 9.3*  ALBUMIN 2.7* 2.4* 2.2*    Cardiac Enzymes  Recent Labs Lab 02/04/16 1447 02/04/16 2018  TROPONINI 0.37* 0.84*    Glucose  Recent Labs Lab 12/31/15 0625 12/31/15 0704 12/31/15 0818 12/31/15 0934 12/31/15 1029 12/31/15 1132  GLUCAP 201* 195* 174* 138* 150* 183*    CXR: slightly improved edema /ARDS pattern   DISCUSSION: Principal Problem:   Pancreatitis, acute Active Problems:   Acute renal failure (HCC)   Hyperkalemia   Lactic acidosis   Acute respiratory failure with hypoxia (HCC)   ARDS (adult respiratory distress syndrome) (HCC)   Refractory shock (HCC)     -Wells Guileseep Gurinder Toral, M.D. 11-18-16  Critical Care Attestation.  I have personally obtained a history, examined the patient, evaluated laboratory and imaging results, formulated the assessment and plan and placed orders. The Patient requires high complexity decision making for assessment and support, frequent evaluation and titration of therapies,  application of advanced monitoring technologies and extensive interpretation of multiple databases. The patient has critical illness that could lead imminently to failure of 1 or more organ systems and requires the highest level of physician preparedness to intervene.  Critical Care Time devoted to patient care services described in this note is 35 minutes and is exclusive of time spent in procedures.

## 2016-02-21 NOTE — Progress Notes (Signed)
PHARMACY - CRITICAL CARE PROGRESS NOTE  Pharmacy Consult for CRRT Medication Adjustment   No Known Allergies  Patient Measurements: Height:  (170.2 cm) Weight: 197 lb 1.5 oz (89.4 kg) IBW/kg (Calculated) : 66.1  Vital Signs: Temp: 99.2 F (37.3 C) (03/22 1200) Temp Source: Oral (03/22 1200) BP: 85/62 mmHg (03/22 1200) Pulse Rate: 98 (03/22 1200) Intake/Output from previous day: 03/21 0701 - 03/22 0700 In: 3615.2 [I.V.:2419.8; IV Piggyback:150; TPN:1045.3] Out: 3656 [Emesis/NG output:100] Intake/Output from this shift: Total I/O In: 206 [I.V.:206] Out: 333 [Other:333] Vent settings for last 24 hours: Vent Mode:  [-] PRVC FiO2 (%):  [40 %] 40 % Set Rate:  [35 bmp] 35 bmp Vt Set:  [400 mL] 400 mL PEEP:  [8 cmH20] 8 cmH20  Labs:  Recent Labs  02/09/16 0446  02/09/16 1826  02/10/16 0430  02/29/16 0231 February 29, 2016 0454 2016-02-29 0807 02-29-2016 1242  WBC 19.9*  --   --   --  17.4*  --   --   --   --   --   HGB 9.5*  --   --   --  10.4*  --   --   --   --   --   HCT 28.0*  --   --   --  30.8*  --   --   --   --   --   PLT 14*  --   --   --  15*  --   --   --   --   --   CREATININE 1.27*  --   --   --  1.20  --   --  1.03  --   --   MG  --   < > 1.6*  < > 1.6*  < > 1.7  --  1.5* 1.5*  PHOS 1.3*  < > 1.8*  --  2.2*  --   --  UNABLE TO REPORT DUE TO ICTERUS  --   --   ALBUMIN 2.2*  --   --   --   --   --   --   --   --   --   PROT 5.3*  --   --   --   --   --   --   --   --   --   AST 443*  --   --   --   --   --   --   --   --   --   ALT 318*  --   --   --   --   --   --   --   --   --   ALKPHOS 316*  --   --   --   --   --   --   --   --   --   BILITOT 9.3*  --   --   --   --   --   --   --   --   --   < > = values in this interval not displayed. Estimated Creatinine Clearance: 75.2 mL/min (by C-G formula based on Cr of 1.03).   Recent Labs  2016-02-29 0934 2016/02/29 1029 2016-02-29 1132  GLUCAP 138* 150* 183*    Microbiology: Recent Results (from the past 720  hour(s))  MRSA PCR Screening     Status: None   Collection Time: 02/04/16  8:39 AM  Result Value Ref Range Status  MRSA by PCR NEGATIVE NEGATIVE Final    Comment:        The GeneXpert MRSA Assay (FDA approved for NASAL specimens only), is one component of a comprehensive MRSA colonization surveillance program. It is not intended to diagnose MRSA infection nor to guide or monitor treatment for MRSA infections.   Culture, blood (Routine X 2) w Reflex to ID Panel     Status: None   Collection Time: 02/04/16 12:34 PM  Result Value Ref Range Status   Specimen Description BLOOD RIGHT ARM  Final   Special Requests   Final    BOTTLES DRAWN AEROBIC AND ANAEROBIC AER 7ML ANA 5ML   Culture NO GROWTH 5 DAYS  Final   Report Status 02/09/2016 FINAL  Final  Culture, blood (Routine X 2) w Reflex to ID Panel     Status: None   Collection Time: 02/04/16 12:49 PM  Result Value Ref Range Status   Specimen Description BLOOD RIGHT HAND  Final   Special Requests   Final    BOTTLES DRAWN AEROBIC AND ANAEROBIC AER 2ML ANA 1ML   Culture NO GROWTH 5 DAYS  Final   Report Status 02/09/2016 FINAL  Final  Culture, respiratory (NON-Expectorated)     Status: None   Collection Time: 02/04/16  2:50 PM  Result Value Ref Range Status   Specimen Description TRACHEAL ASPIRATE  Final   Special Requests NONE  Final   Gram Stain   Final    MODERATE GRAM POSITIVE COCCI IN CHAINS MODERATE RED BLOOD CELLS RARE WBC SEEN    Culture   Final    LIGHT GROWTH KLEBSIELLA PNEUMONIAE MODERATE GROWTH HAEMOPHILUS PARAINFLUENZAE BETA LACTAMASE POSITIVE    Report Status 02/08/2016 FINAL  Final   Organism ID, Bacteria KLEBSIELLA PNEUMONIAE  Final      Susceptibility   Klebsiella pneumoniae - MIC*    AMPICILLIN >=32 RESISTANT Resistant     CEFAZOLIN <=4 SENSITIVE Sensitive     CEFEPIME <=1 SENSITIVE Sensitive     CEFTAZIDIME <=1 SENSITIVE Sensitive     CEFTRIAXONE <=1 SENSITIVE Sensitive     CIPROFLOXACIN <=0.25  SENSITIVE Sensitive     GENTAMICIN <=1 SENSITIVE Sensitive     IMIPENEM <=0.25 SENSITIVE Sensitive     TRIMETH/SULFA <=20 SENSITIVE Sensitive     AMPICILLIN/SULBACTAM 4 SENSITIVE Sensitive     PIP/TAZO <=4 SENSITIVE Sensitive     Extended ESBL NEGATIVE Sensitive     * LIGHT GROWTH KLEBSIELLA PNEUMONIAE    Medications:  Scheduled:  . antiseptic oral rinse  7 mL Mouth Rinse Q2H  . chlorhexidine gluconate  15 mL Mouth Rinse BID  . diazepam  5 mg Intravenous 4 times per day  . hydrocortisone sod succinate (SOLU-CORTEF) inj  50 mg Intravenous Q6H  . lactulose  30 g Oral TID  . magnesium sulfate 1 - 4 g bolus IVPB  2 g Intravenous Once  . pantoprazole (PROTONIX) IV  40 mg Intravenous Daily  . piperacillin-tazobactam (ZOSYN)  IV  3.375 g Intravenous 3 times per day  . sodium chloride flush  10-40 mL Intracatheter Q12H  . thiamine IV  100 mg Intravenous Daily   Infusions:  . Marland Kitchen.TPN (CLINIMIX-E) Adult 40 mL/hr at 02/10/16 1816  . Marland Kitchen.TPN (CLINIMIX-E) Adult    . epinephrine 20 mcg/min (02/10/2016 1247)  . fentaNYL Stopped (02/09/16 1305)  . insulin (NOVOLIN-R) infusion 16.9 Units/hr (01/21/2016 0629)  . norepinephrine (LEVOPHED) Adult infusion 45 mcg/min (01/24/2016 1138)  . propofol (DIPRIVAN) infusion 8 mcg/kg/min (02/09/2016 1312)  .  pureflow 2,000 mL/hr at 02/13/2016 0428  . vasopressin (PITRESSIN) infusion - *FOR SHOCK* 0.03 Units/min (02/10/2016 0857)    Assessment: Pharmacy consulted to assist in adjusting medications for CRRT in this 65 y/o M admitted with ARF and severe pancreatitis.   Plan:  No medications require adjustment for CRRT at present.   Pharmacy will continue to monitor and adjust per consult.   Luisa Hart D 01/24/2016,1:36 PM

## 2016-02-21 NOTE — Progress Notes (Signed)
CDS called to inform of family decision to withdrawal care. Spoke with [name].   Referral Number [id number].  Told donation coordinator will call back with in 10 min for possible donation.

## 2016-02-21 NOTE — Progress Notes (Signed)
[facility] - [place] at [facility]   PATIENT NAME: Jay Mejia    MR#:  [id number]  DATE OF BIRTH:  January 02, 1951  SUBJECTIVE:   Remains critically ill with multiorgan failure. Now back on 3 vasopressors.  IV propofol gtt  Remains Anuric. Remains anemic/thrombocytopenic. On CRRT Wife at bedside  REVIEW OF SYSTEMS:  Unable to obtain given medical condition/mental status  DRUG ALLERGIES:  No Known Allergies  VITALS:  Blood pressure 85/62, pulse 98, temperature 99.2 F (37.3 C), temperature source Oral, resp. rate 37, height  (1.702 m), weight 89.4 kg (197 lb 1.5 oz), SpO2 94 %.  PHYSICAL EXAMINATION:   VITAL SIGNS: Filed Vitals:   02/05/2016 1100 02/14/2016 1200  BP: 89/63 85/62  Pulse: 92 98  Temp:  99.2 F (37.3 C)  Resp: 36 37    GENERAL:65 y.o.male critically ill, intubated and mechanically ventilated  HEAD: Normocephalic, atraumatic.  EYES: Pupils equal, round,  sluggishly reactive to light. Unable to assess extraocular muscles given mental status/medical condition. + scleral icterus.  MOUTH: Dry oral mucosa. Dentition intact. No abscess noted. ET/OG tube in place.  EAR, NOSE, THROAT: Clear without exudates. No external lesions.  NECK: Supple. No thyromegaly. No nodules. No JVD.  PULMONARY:  coarse breath sounds left greater than right without wheeze rales or rhonchi. No use of accessory muscles,poor respiratory effort. good air entry bilaterally CHEST: Nontender to palpation.  CARDIOVASCULAR: S1 and S2. Regular rate and rhythm. No murmurs, rubs, or gallops. No edema. Pedal pulses 2+ bilaterally.  GASTROINTESTINAL: Soft, nontender, nondistended. No masses. nobowel sounds. No hepatosplenomegaly. Foley catheter MUSCULOSKELETAL: No swelling, clubbing, +1-2 edema b/l NEUROLOGIC: Unable to assess given mental status/medical condition SKIN: No ulceration, lesions, rashes, or cyanosis. Skin warm and dry. Turgor intact.  PSYCHIATRIC: Unable to  assess given mental status/medical condition   LABORATORY PANEL:   CBC  Recent Labs Lab 02/10/16 0430  WBC 17.4*  HGB 10.4*  HCT 30.8*  PLT 15*   ------------------------------------------------------------------------------------------------------------------  Chemistries   Recent Labs Lab 02/09/16 0446  02/10/2016 1242  NA 132*  < > 132*  K 4.0  < > 4.2  CL 102  < > 103  CO2 26  < > 21*  GLUCOSE 269*  < > 187*  BUN 26*  < > 36*  CREATININE 1.27*  < > 1.35*  CALCIUM 7.7*  < > 7.9*  MG  --   < > 1.5*  AST 443*  --   --   ALT 318*  --   --   ALKPHOS 316*  --   --   BILITOT 9.3*  --   --   < > = values in this interval not displayed. ------------------------------------------------------------------------------------------------------------------  Cardiac Enzymes  Recent Labs Lab 02/04/16 2018  TROPONINI 0.84*   ------------------------------------------------------------------------------------------------------------------  RADIOLOGY:  Dg Chest 1 View  02/10/2016  CLINICAL DATA:  Shortness of breath. EXAM: CHEST 1 VIEW COMPARISON:  02/07/2016. FINDINGS: Endotracheal tube, NG tube, right IJ line stable position. Mediastinum hilar structures normal. Left lower lobe infiltrate noted most likely pneumonia. Small left pleural effusion. Interim partial clearing of right base infiltrate and/or atelectasis. IMPRESSION: 1. Lines and tubes stable position. 2. Left lower lobe infiltrate most likely secondary to pneumonia. Small left pleural effusion cannot be excluded. 3. Interim near complete clearing of right base atelectasis and or infiltrate. Electronically Signed   By: Maisie Fus  Register   On: 02/10/2016 07:43    ASSESSMENT AND PLAN:   65 -year-old Caucasian gentleman  admitted 01/30/2016 with apparent alcoholic pancreatitis, to get him by acute kidney injury, hyperkalemia. It appears that he is under resisted from volume standpoint  1. Post cardiac arrest: likely  Related to hyperkalemia patient was not on off unit telemetry  -No arrhythmia.  Echo showing normal EF of 50-55%.   2. Acute respiratory failure hypoxemic requiring mechanical ventilation due to underlying ARDS post cardiac arrest and underlying acute pancreatitis. -Pulmonary following and defer vent management to them.   Given the critical nature of illness family is considering withdrawal of care with terminal extubation.  3. Acute kidney injury/ATN: With metabolic acidosis. Status post of CRRT. Remains anuric. -Creatinine slowly trending down  4. Alcoholic pancreatitis with severe shock liver -US abdomen shows gallstones and sludge. S/o cholecystitis -empiric on Zosyn - cont.Supportive care w/ IV fluids, 3 IV vasopressors -LFTs remain elevated including LDH and PT/INR  5. Anemia/thrombocytopenia-due to underlying DIC/sepsis -Continue supportive care and follow counts. Transfuse as needed. Hold anticoagulants.  6. Shock-hypovolemic/septic. - IV  vasopressors, broad-spectrum IV antibiotics with Zosyn.  7. Hypokalemia - correct w/ CRRT as per Nephrology   8. DVT prophylaxis SCD and teds.  9. Nutrition on TPN  10. DM-2 on insulin gtt and SSI  Pt. Remains critically ill with multi-organ failure and high risk for Cardioresp. Death. Prognosis remains poor family is leaning towards terminal extubation  CODE STATUS:DNR  TOTAL Critical Care TIME TAKING CARE OF THIS PATIENT: 35 min.    Spoke with wife and 2 daughters  Kapri Nero M.D on 2016-09-28 at 2:33 PM  Between 7am to 6pm - Pager - [phone]  After 6pm: House Pager: - [phone]  [facility]  Office  [phone]  CC: Primary care physician; Clydie BraunFITZGERALD, DAVID, MD

## 2016-02-21 NOTE — Progress Notes (Signed)
°   2016-11-04 1400  Clinical Encounter Type  Visited With Family  Visit Type Follow-up  Referral From Nurse  Consult/Referral To Chaplain  Stress Factors  Patient Stress Factors Major life changes  Pastoral care and brief ritual with family as life support is removed from patient.  With [employer] Copper & GoldChaplains Melvin and Greig CastillaAndrew. Chaplain [employer] GroupEvelyn Crews Ext [phone]

## 2016-02-21 NOTE — Progress Notes (Signed)
Subjective:  No urine output over the preceding 24 hours. Remains on CRRT. Shock unfortunately persists. Most recent blood pressure was 76/54.  Objective:  Vital signs in last 24 hours:  Temp:  [97.8 F (36.6 C)-98.6 F (37 C)] 98.5 F (36.9 C) (03/22 0800) Pulse Rate:  [72-92] 92 (03/22 0800) Resp:  [28-37] 30 (03/22 0800) BP: (70-85)/(54-61) 76/54 mmHg (03/22 0800) SpO2:  [92 %-100 %] 93 % (03/22 0800) Arterial Line BP: (79-176)/(44-95) 92/49 mmHg (03/22 0800) FiO2 (%):  [40 %] 40 % (03/22 0800) Weight:  [89.4 kg (197 lb 1.5 oz)] 89.4 kg (197 lb 1.5 oz) (03/22 0439)  Weight change: 0.8 kg (1 lb 12.2 oz) Filed Weights   02/09/16 0453 02/10/16 0426 01/26/2016 0439  Weight: 92.2 kg (203 lb 4.2 oz) 88.6 kg (195 lb 5.2 oz) 89.4 kg (197 lb 1.5 oz)    Intake/Output:    Intake/Output Summary (Last 24 hours) at 01/28/2016 0902 Last data filed at 01/27/2016 0700  Gross per 24 hour  Intake 3174.32 ml  Output   3317 ml  Net -142.68 ml     Physical Exam: General: Critically ill appearing  HEENT Eyes closed, ETT  Neck supple  Pulm/lungs Coarse crackles b/l, Vent assisted  CVS/Heart S1S2 no rubs  Abdomen:  Soft, distended, scant BS  Extremities: 2+ b/l LE edema  Neurologic: Sedated, not following commands  Skin: No acute rashes  Access: Right femoral dialysis catheter       Basic Metabolic Panel:   Recent Labs Lab 02/07/16 1831 02/07/16 2356 02/08/16 0400  02/09/16 0446  02/09/16 1122  02/09/16 1826  02/10/16 0430 02/10/16 0635 02/10/16 2242 02/10/16 2305 01/23/2016 0231 02/16/2016 0807  NA 129* 131* 130*  --  132*  --   --   --   --   --  131*  --   --   --   --   --   K 4.1 4.1 4.1  --  4.0  --   --   --   --   --  4.1  --   --   --   --   --   CL 100* 100* 100*  --  102  --   --   --   --   --  102  --   --   --   --   --   CO2 20* 24 25  --  26  --   --   --   --   --  24  --   --   --   --   --   GLUCOSE 243* 253* 271*  --  269*  --   --   --   --   --  354*   --   --   --   --   --   BUN 21* 20 23*  --  26*  --   --   --   --   --  31*  --   --   --   --   --   CREATININE 1.49* 1.40* 1.60*  --  1.27*  --   --   --   --   --  1.20  --   --   --   --   --   CALCIUM 7.5* 7.6* 7.4*  --  7.7*  --   --   --   --   --  7.6*  --   --   --   --   --  MG 1.7 1.7 1.7  < >  --   < > 1.6*  < > 1.6*  < > 1.6* 1.7 1.7 1.6* 1.7 1.5*  PHOS  --   --  1.4*  --  1.3*  --  1.2*  --  1.8*  --  2.2*  --   --   --   --   --   < > = values in this interval not displayed.   CBC:  Recent Labs Lab 02/05/16 0237  02/06/16 0333 02/07/16 0449 02/07/16 0532 02/09/16 0446 02/10/16 0430  WBC 7.4  --  14.2* 25.7*  --  19.9* 17.4*  NEUTROABS  --   --   --   --   --  17.7*  --   HGB 8.3*  --  7.3* 8.8*  --  9.5* 10.4*  HCT 26.0*  --  23.0* 26.7*  --  28.0* 30.8*  MCV 109.3*  --  105.6* 97.6  --  96.7 98.1  PLT 45*  < > 26*  28* 20* 19* 14* 15*  < > = values in this interval not displayed.    Microbiology:  Recent Results (from the past 720 hour(s))  MRSA PCR Screening     Status: None   Collection Time: 02/04/16  8:39 AM  Result Value Ref Range Status   MRSA by PCR NEGATIVE NEGATIVE Final    Comment:        The GeneXpert MRSA Assay (FDA approved for NASAL specimens only), is one component of a comprehensive MRSA colonization surveillance program. It is not intended to diagnose MRSA infection nor to guide or monitor treatment for MRSA infections.   Culture, blood (Routine X 2) w Reflex to ID Panel     Status: None   Collection Time: 02/04/16 12:34 PM  Result Value Ref Range Status   Specimen Description BLOOD RIGHT ARM  Final   Special Requests   Final    BOTTLES DRAWN AEROBIC AND ANAEROBIC AER ANA   Culture NO GROWTH 5 DAYS  Final   Report Status 02/09/2016 FINAL  Final  Culture, blood (Routine X 2) w Reflex to ID Panel     Status: None   Collection Time: 02/04/16 12:49 PM  Result Value Ref Range Status   Specimen Description BLOOD RIGHT  HAND  Final   Special Requests   Final    BOTTLES DRAWN AEROBIC AND ANAEROBIC AER ANA   Culture NO GROWTH 5 DAYS  Final   Report Status 02/09/2016 FINAL  Final  Culture, respiratory (NON-Expectorated)     Status: None   Collection Time: 02/04/16  2:50 PM  Result Value Ref Range Status   Specimen Description TRACHEAL ASPIRATE  Final   Special Requests NONE  Final   Gram Stain   Final    MODERATE GRAM POSITIVE COCCI IN CHAINS MODERATE RED BLOOD CELLS RARE WBC SEEN    Culture   Final    LIGHT GROWTH KLEBSIELLA PNEUMONIAE MODERATE GROWTH HAEMOPHILUS PARAINFLUENZAE BETA LACTAMASE POSITIVE    Report Status 02/08/2016 FINAL  Final   Organism ID, Bacteria KLEBSIELLA PNEUMONIAE  Final      Susceptibility   Klebsiella pneumoniae - MIC*    AMPICILLIN >=32 RESISTANT Resistant     CEFAZOLIN <=4 SENSITIVE Sensitive     CEFEPIME <=1 SENSITIVE Sensitive     CEFTAZIDIME <=1 SENSITIVE Sensitive     CEFTRIAXONE <=1 SENSITIVE Sensitive     CIPROFLOXACIN <=0.25 SENSITIVE Sensitive  GENTAMICIN <=1 SENSITIVE Sensitive     IMIPENEM <=0.25 SENSITIVE Sensitive     TRIMETH/SULFA <=20 SENSITIVE Sensitive     AMPICILLIN/SULBACTAM 4 SENSITIVE Sensitive     PIP/TAZO <=4 SENSITIVE Sensitive     Extended ESBL NEGATIVE Sensitive     * LIGHT GROWTH KLEBSIELLA PNEUMONIAE    Coagulation Studies: No results for input(s): LABPROT, INR in the last 72 hours.  Urinalysis: No results for input(s): COLORURINE, LABSPEC, PHURINE, GLUCOSEU, HGBUR, BILIRUBINUR, KETONESUR, PROTEINUR, UROBILINOGEN, NITRITE, LEUKOCYTESUR in the last 72 hours.  Invalid input(s): APPERANCEUR    Imaging: Dg Chest 1 View  02/10/2016  CLINICAL DATA:  Shortness of breath. EXAM: CHEST 1 VIEW COMPARISON:  02/07/2016. FINDINGS: Endotracheal tube, NG tube, right IJ line stable position. Mediastinum hilar structures normal. Left lower lobe infiltrate noted most likely pneumonia. Small left pleural effusion. Interim partial clearing  of right base infiltrate and/or atelectasis. IMPRESSION: 1. Lines and tubes stable position. 2. Left lower lobe infiltrate most likely secondary to pneumonia. Small left pleural effusion cannot be excluded. 3. Interim near complete clearing of right base atelectasis and or infiltrate. Electronically Signed   By: Maisie Fushomas  Register   On: 02/10/2016 07:43   Dg Abd 1 View  02/09/2016  CLINICAL DATA:  Abdominal pain. Acute pancreatitis with constipation. EXAM: ABDOMEN - 1 VIEW COMPARISON:  One view abdomen 02/04/2016. FINDINGS: 1034 hours. Nasogastric tube is looped over the proximal stomach. There is a left femoral line which terminates over the upper sacrum. There is also a small caliber right femoral line which terminates over the right sacroiliac joint. Bilateral femoral vascular stents are present. The bowel gas pattern is normal. There is no supine evidence of free intraperitoneal air. No acute osseous findings are seen. IMPRESSION: No acute abdominal findings.  Support system positioned as above. Electronically Signed   By: Carey BullocksWilliam  Veazey M.D.   On: 02/09/2016 12:44     Medications:   . Marland Kitchen.TPN (CLINIMIX-E) Adult 40 mL/hr at 02/10/16 1816  . epinephrine 20 mcg/min (01/24/2016 0856)  . fentaNYL Stopped (02/09/16 1305)  . insulin (NOVOLIN-R) infusion 16.9 Units/hr (01/28/2016 0629)  . norepinephrine (LEVOPHED) Adult infusion 40 mcg/min (02/20/2016 0640)  . propofol (DIPRIVAN) infusion 15 mcg/kg/min (02/04/2016 0032)  . pureflow 2,000 mL/hr at 02/10/2016 0428  . vasopressin (PITRESSIN) infusion - *FOR SHOCK* 0.03 Units/min (02/10/2016 0857)   . antiseptic oral rinse  7 mL Mouth Rinse Q2H  . chlorhexidine gluconate  15 mL Mouth Rinse BID  . diazepam  5 mg Intravenous 4 times per day  . hydrocortisone sod succinate (SOLU-CORTEF) inj  50 mg Intravenous Q6H  . lactulose  30 g Oral TID  . pantoprazole (PROTONIX) IV  40 mg Intravenous Daily  . piperacillin-tazobactam (ZOSYN)  IV  3.375 g Intravenous 3 times per  day  . sodium chloride flush  10-40 mL Intracatheter Q12H  . thiamine IV  100 mg Intravenous Daily   fentaNYL, fentaNYL (SUBLIMAZE) injection, heparin, midazolam, sodium chloride flush  Assessment/ Plan:  65 y.o. male with a PMHX of Coronary disease, cirrhosis, Esophageal varices, portal hypertensive gastropathy, type 2 diabetes, hypertension, left carotid surgery. peripheral vascular disease, was admitted on August 04, 2016 with abdominal pain/Acute pancreatitis.  3/15- AM - Code blue - transferred to ICU - CRRT started 3/16- Multiple pressors- CRRT contd. anuric 3/17- UF rate adjusted to 210cc/hr 3/20 UF rate adjusted down to 175cc/hr. 3/22 Anuria persists, remains critically ill requiring high dose pressors  1. Acute renal failure. (Baseline Cr 0.9) with multiple organ failure,  due to ATN.  Likely secondary to severe ATN from Acute pancreatitis secondary to alcohol use  -Unfortunately anuria persists at this time. For now we will maintain the patient on continuous renal replacement therapy. Pulmonary critical care to have discussion regarding goals of care today.  2. High anion gap acidosis Lactic acidosis  -Acidosis improved with CRRT. Continue to monitor serum bicarbonate.  3. Hyperkalemia Potassium 4.1 and acceptable. Continue to periodically monitor.  4. Acute respiratory failure. Patient with ARDS. -It has been difficult to wean the patient from the ventilator. FiO2 remains 40%.  5.  Hyponatremia: Sodium remains low at 131 despite continuous renal replacement therapy. Continue to monitor serum sodium.  6.  Thrombocytopenia. Likely related to severe sepsis. Defer management to pulmonary critical care.      LOS: 8 Nasire Reali [id number]-Mar-[id number] AM

## 2016-02-21 NOTE — Progress Notes (Addendum)
Notified Dr. Ardyth Manam that patient is on levophed at 40mcqs- Epi- at 20mcqs and asked about goal MAP of patient- he stated to keep MAP 60 and above and may start vasopressin if needed.  Also stated that patient is sedated with propofol at 15mcqs and is unresponsive to mouth care and ET suction.  I also was told in report from night shift RN that this patient would be a medical examiner case- though we did not see any notes stating this or orders-  I asked Dr. Ardyth Manam about this and he said he had not heard of this patient being a medical examiner case. Then I spoke to Dr. Cherylann RatelLateef and he stated that MAP goal is 65 and above- now patient on vasopressin as well.  Weaning sedation down slowly. Per Dr. Ardyth Manam- keep patient comfortable- no sedation vacation.

## 2016-02-21 NOTE — Progress Notes (Signed)
Notified Dr. Ardyth Manam that at first the CRRT machine clotted off and then once the machine started running- the patient's line clotted off.  Now Cath-flo is indwelling in the trialysis cath at this time.

## 2016-02-21 NOTE — Progress Notes (Signed)
Pharmacy Antibiotic Note  Jay Mejia is a 65 y.o. male with acute renal failure requiring CRRT admitted on 02/17/2016 with severe pancreatitis.  Pharmacy has been consulted for Zosyn dosing.  PCT= 1.82  Plan: Continue Zosyn 3.375g IV q8h (4 hour infusion).  Height: 5\' 7"  (170.2 cm) Weight: 197 lb 1.5 oz (89.4 kg) IBW/kg (Calculated) : 66.1  Temp (24hrs), Avg:98.3 F (36.8 C), Min:97.8 F (36.6 C), Max:99.2 F (37.3 C)   Recent Labs Lab 02/05/16 0237  02/06/16 0333  02/07/16 0449  02/07/16 2356 02/08/16 0400 02/09/16 0446 02/10/16 0430 Nov 07, 2016 0454  WBC 7.4  --  14.2*  --  25.7*  --   --   --  19.9* 17.4*  --   CREATININE 3.94*  < > 2.70*  2.79*  < > 1.85*  < > 1.40* 1.60* 1.27* 1.20 1.03  < > = values in this interval not displayed.  Estimated Creatinine Clearance: 75.2 mL/min (by C-G formula based on Cr of 1.03).    No Known Allergies  Antimicrobials this admission: Zosyn 3/15 >>    Dose adjustments this admission:   Microbiology results: 3/15 BCx: negative 3/15 TA: Klebsiella pneumoniae 3/15 MRSA PCR: negative  Thank you for allowing pharmacy to be a part of this patient's care.  Luisa HartChristy, Kindle Strohmeier D 02/07/2016 1:36 PM

## 2016-02-21 DEATH — deceased

## 2016-03-17 ENCOUNTER — Other Ambulatory Visit: Payer: Medicare Other

## 2016-03-17 ENCOUNTER — Ambulatory Visit: Payer: Medicare Other | Admitting: Oncology
